# Patient Record
Sex: Male | Born: 1960 | Race: White | Hispanic: No | Marital: Married | Smoking: Never smoker
Health system: Southern US, Community
[De-identification: ages and names within clinical notes are randomized; demographics above are authoritative.]

## PROBLEM LIST (undated history)

## (undated) DIAGNOSIS — I255 Ischemic cardiomyopathy: Secondary | ICD-10-CM

## (undated) DIAGNOSIS — I2510 Atherosclerotic heart disease of native coronary artery without angina pectoris: Secondary | ICD-10-CM

## (undated) DIAGNOSIS — I10 Essential (primary) hypertension: Secondary | ICD-10-CM

## (undated) HISTORY — DX: Atherosclerotic heart disease of native coronary artery without angina pectoris: I25.10

## (undated) HISTORY — DX: Ischemic cardiomyopathy: I25.5

---

## 1999-02-17 HISTORY — PX: NECK SURGERY: SHX720

## 2000-04-07 ENCOUNTER — Inpatient Hospital Stay (HOSPITAL_COMMUNITY): Admission: RE | Admit: 2000-04-07 | Discharge: 2000-04-08 | Payer: Self-pay | Admitting: Neurosurgery

## 2000-04-07 ENCOUNTER — Encounter: Payer: Self-pay | Admitting: Neurosurgery

## 2000-04-30 ENCOUNTER — Encounter: Admission: RE | Admit: 2000-04-30 | Discharge: 2000-04-30 | Payer: Self-pay | Admitting: Neurosurgery

## 2000-04-30 ENCOUNTER — Encounter: Payer: Self-pay | Admitting: Neurosurgery

## 2000-06-11 ENCOUNTER — Encounter: Payer: Self-pay | Admitting: Neurosurgery

## 2000-06-11 ENCOUNTER — Encounter: Admission: RE | Admit: 2000-06-11 | Discharge: 2000-06-11 | Payer: Self-pay | Admitting: Neurosurgery

## 2001-09-26 ENCOUNTER — Encounter: Admission: RE | Admit: 2001-09-26 | Discharge: 2001-10-13 | Payer: Self-pay | Admitting: Unknown Physician Specialty

## 2002-12-14 ENCOUNTER — Observation Stay (HOSPITAL_COMMUNITY): Admission: EM | Admit: 2002-12-14 | Discharge: 2002-12-15 | Payer: Self-pay | Admitting: Emergency Medicine

## 2005-06-19 ENCOUNTER — Ambulatory Visit: Payer: Self-pay | Admitting: Family Medicine

## 2005-09-10 ENCOUNTER — Ambulatory Visit: Payer: Self-pay | Admitting: Family Medicine

## 2006-04-22 ENCOUNTER — Ambulatory Visit: Payer: Self-pay | Admitting: Family Medicine

## 2007-11-21 ENCOUNTER — Ambulatory Visit: Payer: Self-pay | Admitting: Cardiology

## 2007-11-23 ENCOUNTER — Ambulatory Visit: Payer: Self-pay

## 2007-12-07 ENCOUNTER — Ambulatory Visit: Payer: Self-pay | Admitting: Cardiology

## 2007-12-14 ENCOUNTER — Ambulatory Visit: Payer: Self-pay | Admitting: Cardiology

## 2008-03-09 ENCOUNTER — Ambulatory Visit (HOSPITAL_COMMUNITY): Admission: RE | Admit: 2008-03-09 | Discharge: 2008-03-09 | Payer: Self-pay | Admitting: Internal Medicine

## 2008-03-09 ENCOUNTER — Ambulatory Visit: Payer: Self-pay | Admitting: Internal Medicine

## 2010-07-01 NOTE — Assessment & Plan Note (Signed)
[facility]                            CARDIOLOGY OFFICE NOTE   Devin Jackson, Devin Jackson                         MRN:          [id number]  DATE:11/21/2007                            DOB:          December 13, 1960    PRIMARY CARE PHYSICIAN:  Dr. [name].   REASON FOR PRESENTATION:  Evaluate the patient with dyspnea.   HISTORY OF PRESENT ILLNESS:  The patient is a pleasant 50 year old  gentleman who we saw several years ago.  He had chest discomfort.  In  2004, he underwent cardiac catheterization and was found to have luminal  irregularities in obtuse marginal, but otherwise no significant disease.  He has had progressive dyspnea.  This has been over the last several  months that he has thought it to be somewhat limiting.  He describes  getting short of breath, walking less than the distance of a football  field.  If he walks up any incline.  If he climbs stairs.  He is  dyspneic with a rapid heart rate.  He feels much more fatigued than he  used to.  When he stops that he is doing, then his symptoms resolve over  minutes.  He has had not had any resting shortness of breath.  Denies  any PND or orthopnea.  He has not had any chest pressure, neck, or arm  discomfort.  He is not having any presyncope or syncope.  He did have  pulmonary function tests which demonstrated no significant abnormality.  The chest x-ray suggested cardiomegaly, but no acute diseases.  There  was minimal bibasilar atelectasis and bronchitic changes.  The patient  has had no cough, fevers, or chills.   PAST MEDICAL HISTORY:  He has no history of diabetes, hypertension, and  hyperlipidemia.   PAST SURGICAL HISTORY:  Cervical disk surgery.   ALLERGIES:  MORPHINE.   MEDICATIONS:  None.   SOCIAL HISTORY:  The patient is currently unemployed.  He is married and  has 2 children.  He has not smoked cigarettes.  He drinks alcohol  socially.   FAMILY HISTORY:  Remarkable.  His brother had  myocardial infarction at  age 52.  His father had coronary artery disease at age 53, dying of  this.   REVIEW OF SYSTEMS:  As stated in the HPI and positive for episodes of  dizziness and tingling, recently the etiology is not clear.  Negative  for all other systems.   PHYSICAL EXAMINATION:  GENERAL:  The patient is in no distress.  VITAL SIGNS:  Blood pressure is 136/82, heart rate 51 and regular,  weight 167 pounds, and body mass index not calculated.  HEENT:  Eyelids  are unremarkable, pupils equal, round, and reactive to light, fundi  within normal limits, oral mucosa unremarkable.  NECK:  No jugular  venous distention at 45 degrees, carotid upstroke brisk and symmetrical,  no bruits, no thyromegaly.  LYMPHATICS:  No cervical, axillary, or inguinal adenopathy.  LUNGS:  Clear to auscultation bilaterally.  BACK:  No costovertebral angle tenderness.  CHEST:  Unremarkable.  HEART:  PMI not displaced  or sustained, S1 and S2 within normal limits,  no S3, no S4, no clicks, rubs, or murmurs.  ABDOMEN:  Mildly obese, positive bowel sounds.  Normal in frequency and  pitch.  No bruits, no rebound, no guarding or midline pulsatile mass.  No hepatomegaly or splenomegaly.  SKIN:  No rashes, no nodules.  EXTREMITIES:  Pulses are 2+ throughout.  No edema, no cyanosis or  clubbing.  NEURO:  Oriented to person, place, and time.  Cranial nerves II-XII  grossly intact, motor grossly intact.   EKG (done in Mr. [name] office) sinus rhythm, rate 84, axis within  normal limits, intervals within normal limits, no acute ST-wave changes.   ASSESSMENT AND PLAN:  1. Dyspnea.  The patient has progressive dyspnea with exertion.  This      is starting to become somewhat limiting.  He has had a pulmonary      workup without any significant abnormalities on resting pulmonary      function test.  At this point given his family history and      obstructive coronary artery disease, he needs to be considered.   I      think there is a moderate possibility that this is the etiology.      Therefore, he needs the added sensitivity of a stress perfusion      test.  I will order an exercise Cardiolite.  If this does not      demonstrate the etiology to his dyspnea, we will might consider      cardiopulmonary stress testing.  2. Risk reduction.  The patient has his lipids followed by Mr. [name],      that suggest an LDL less than 100 and HDL greater than 40 as goals      given his family history.  3. Followup.  I will see him based on future symptoms with the results      of his stress test.     Rollene Rotunda, MD, [facility]  Electronically Signed    JH/MedQ  DD: 11/21/2007  DT: 11/22/2007  Job #: [id number]   cc:   [name]

## 2010-07-01 NOTE — Assessment & Plan Note (Signed)
[facility]                            CARDIOLOGY OFFICE NOTE   SHAUL, TRAUTMAN                         MRN:          [id number]  DATE:12/07/2007                            DOB:          12/17/1960    PRIMARY CARE PHYSICIAN:  [name], PA [facility].   REASON FOR PRESENTATION:  Evaluate the patient with dyspnea.   HISTORY OF PRESENT ILLNESS:  The patient is a pleasant 50 year old  gentleman who returns for evaluation of shortness of breath.  He had a  stress perfusion study.  This did not demonstrate any of ischemia though  his target heart rate field just short of 85% of predicted.  He  exercises 82% of predicted and was stopped because of accelerated blood  pressure response.  His maximum blood pressure was 95/119.  He did have  a little dizziness and some shortness of breath.  The shortness of  breath is unchanged from that described on November 21, 2007 note.  He  gets dyspneic walking up an incline or a flight of stairs.  He does not  describe any resting shortness of breath, does not have any PND or  orthopnea.  He has not had any weight gain or swelling.  He has not had  any cough, fevers, or chills.  He has had pulmonary workup.  He has  never been told that he had hypertension before.   PAST MEDICAL HISTORY:  He has no history of diabetes, hypertension, or  hyperlipidemia.   PAST SURGICAL HISTORY:  There was cervical disk surgery.   ALLERGIES:  MORPHINE.   MEDICATIONS:  None.   REVIEW OF SYSTEMS:  As stated in the HPI and otherwise negative for all  other systems.   PHYSICAL EXAMINATION:  GENERAL:  The patient is in no distress.  VITAL SIGNS:  Blood pressure 140/80 both arms, heart rate 60 and  regular, weight 178 pounds.  HEENT:  Eyes unremarkable.  Pupils are equal, round, and reactive to  light.  Fundi not visualized.  Oral mucosa unremarkable.  NECK:  No jugular venous distention at 45 degrees.   Carotid upstroke is  brisk and symmetric.  No bruits.  No thyromegaly.  LYMPHATICS:  No cervical, axillary, or inguinal adenopathy.  LUNGS:  Clear to auscultation bilaterally.  BACK:  No costovertebral angle tenderness.  CHEST:  Unremarkable.  HEART:  PMI not displaced or sustained.  S1 and S2 within normals.  No  S3, S4.  No clicks, rubs, murmurs.  ABDOMEN:  Flat, positive bowel sounds.  Normal in frequency and pitch.  No bruits, rebound or guarding.  No midline pulsatile mass.  No  hepatomegaly.  No splenomegaly.  SKIN:  No rashes.  No nodules.  EXTREMITIES:  2+ pulses throughout.  No edema, cyanosis, or clubbing.  NEUROLOGIC:  Oriented to person, place, and time.  Cranial nerves II  through XII are grossly intact.  Motor grossly intact.   ASSESSMENT/PLAN:  1. Dyspnea.  I wonder this might be related to his hypertensive      response with activity.  He has upper limits of normal blood      pressure today.  I am going to have him wear ambulatory blood      pressure monitor to see if this correlates with spiking blood      pressures and dyspnea and to see what his general blood pressure      is.  I believe he is going to need management for this.  If I do      not have another explanation for his dyspnea, then he might in the      future need a cardiopulmonary stress test.  2. Risk reduction.  The patient is having his lipids followed by [name].  He will continue with this.  3. Followup.  I would like to see him back after the results of the      ambulatory blood pressure monitor and make further decisions.     Rollene Rotunda, MD, [facility]  Electronically Signed    JH/MedQ  DD: 12/07/2007  DT: 12/07/2007  Job #: [id number]   cc:   [name], PA

## 2010-07-04 NOTE — Discharge Summary (Signed)
NAME:  Devin Jackson, Devin Jackson NO.:  [id number]   MEDICAL RECORD NO.:  [id number]                   PATIENT TYPE:  INP   LOCATION:  3707                                 FACILITY:  [facility]   PHYSICIAN:  Jonelle Sidle, M.D. [facility]        DATE OF BIRTH:  August 30, 1960   DATE OF ADMISSION:  12/14/2002  DATE OF DISCHARGE:  12/15/2002                           DISCHARGE SUMMARY - REFERRING   PROCEDURES:  1. Cardiac catheterization.  2. Coronary arteriogram.  3. Left ventriculogram.   HOSPITAL COURSE:  Mr. Devin Jackson is a 50 year old male with no known history of  coronary artery disease. He described a two month history of upper left  chest pain with exertion. It responded to rest in approximately 10 minutes.  He also had associated left arm aching and tingling down in his hand. He saw  Dr. Dewaine Conger and was referred to cardiology. He had recurrent symptoms and was  admitted for further evaluation and treatment.   Mr. Devin Jackson ruled out for an MI, but it was felt that cardiac catheterization  was indicated. The cardiac risk factors of dyslipidemia and family history  of premature coronary artery disease.   The cardiac catheterization showed luminal irregularities in several  vessels, but no significant stenosis and an EF of 60% with normal wall  motion. Dr. Antoine Poche evaluated the films and felt that he had minimal  coronary plaque with normal left ventricular function and therefore his  symptoms were not secondary to ischemia. He felt that primary risk reduction  was the best option. A lipid profile was checked.   Post catheterization Mr. Devin Jackson was ambulating without chest pain or shortness  of breath and his groin was stable. Lipid profile was drawn and came back  showing a total cholesterol of 231, triglycerides 435, HDL 31 and LDL not  calculated. The situation was discussed with the patient who felt that his  diet could be significantly improved and did not wish medical  therapy at  this time. His family was present as well. Mr. Devin Jackson was ambulating without  chest pain or shortness of breath and considered stable for discharge on  December 15, 2002.   LABORATORY DATA:  Hemoglobin 16.0, hematocrit 46.7, WBCs 7.9, platelets 198.  Sodium 138, potassium 4.5, chloride 104, CO2 31, BUN 12, creatinine 1.1,  glucose 99. Serial CK-MB and troponin I negative for MI.   Chest x-ray:  Borderline cardiomegaly with normal vascularity and clear  lungs. There is evidence of previous anterior cervical fusion.   CONDITION ON DISCHARGE:  Improved.   DISCHARGE DIAGNOSES:  1. Exertional chest pain, no critical coronary artery disease by     catheterization and preserved left ventricular function.  2. History of hyperlipidemia with hypertriglyceridemia.  3. Family history of premature coronary artery disease.  4. Status post neck surgery.  5. History of side effects to morphine including nausea and vomiting.   DISCHARGE INSTRUCTIONS:  His  activity level is to include no driving, sex,  or strenuous activities for two days. He is to stick to a low-fat diet and  to call the office for problems with catheterization site. He is to see Dr.  Dewaine Conger within two weeks and call Dr. Diona Browner p.r.n.   DISCHARGE MEDICATIONS:  1. Baby aspirin 81 mg daily.  2. Tylenol p.r.n.        Lavella Hammock, P.A. [facility]                  Jonelle Sidle, M.D. [facility]    RG/MEDQ  D:  12/15/2002  T:  12/15/2002  Job:  [id number]   cc:   Colon Flattery, MD  [address]  [place]  [place] [address]  Fax: [phone]

## 2010-07-04 NOTE — H&P (Signed)
[place]  Patient:    Devin Jackson, Devin Jackson                         MRN: [id number] Adm. Date:  [id number] Attending:  Barton Fanny                         History and Physical  HISTORY OF PRESENT ILLNESS:  The patient is a 50 year old, right-handed white male who was evaluated for difficulties in both his neck and his low back.  He has had a number of difficulties with numbness and tingling in his feet and hands, and a sense of weakness in his right hand.  He has found that he has been dropping things from his right hand, including hammers, paint brushes, etc.  These symptoms began in October or November 2001.  He found that turning his neck would cause neck pain.  He found that working with his upper extremities at the shoulder level or above would cause neck pain and pain to the upper extremities.  Standing would tend to cause numbness and tingling in his feet.  He noted occasionally low back pain, typically self-limited, and not particularly disabling.  The patient also underwent a neurologic evaluation and not particularly disabling.  The patient also underwent a neurologic evaluation because of difficulties with his memory.  An MRI of the brain was done which was apparently normal.  The patient had an MRI of the cervical and lumbar spine, and was evaluated in our office.  The lumbar spine showed degenerative disk disease at the L4-5 and L5-S1 levels, with small superimposed disk herniations at L4-5 and L5-S1 off to the right side, but no significant thecal sac or nerve root compression.  The cervical study showed a moderately large right C6-7 cervical disk herniation, superimposed upon degenerative disk disease and spondylosis, and the patient is admitted now for a single level C6-7 anterior cervical diskectomy and arthrodesis with allograft and cervical plating.  PAST MEDICAL HISTORY:  Notable for a history of kidney stones, but he does  not describe any history of hypertension, myocardial infarction, cancer, stroke, diabetes mellitus, peptic ulcer disease, or lung disease.  PAST SURGICAL HISTORY:  None.  ALLERGIES:  He reports that MORPHINE tends to make him sick, but he has actual allergic reaction to medications.  CURRENT MEDICATIONS:  He takes no medications on a regular basis.  FAMILY HISTORY:  His father died at age 56 of a heart attack.  He also suffered from prostate cancer and lung cancer.  His mother is in good health at age 78, but has suffered a heart attack.  SOCIAL HISTORY:  The patient is married.  He works as a self-employed Environmental education officer.  He does not smoke.  He does not drink alcoholic beverages.  He denies a history of substance abuse.  REVIEW OF SYSTEMS:  Notable for that as described in the history of present illness and past medical history.  Otherwise is unremarkable.  PHYSICAL EXAMINATION:  GENERAL:  The patient is a well-developed, well-nourished white male, in no acute distress.  VITAL SIGNS:  Temperature 97.3 degrees, pulse 57, blood pressure 130/83, respirations 18.  Height 5 feet 9 inches, weight 150 pounds.  LUNGS:  Clear to auscultation.  He had symmetrical respiratory excursion.  HEART:  A regular rate and rhythm.  Normal S1, S2.  No murmur.  ABDOMEN:  Soft, nondistended.  Bowel sounds  present.  EXTREMITIES:  Show no cyanosis, clubbing, or edema.  MUSCULOSKELETAL:  Shows no significant tenderness per the patient over the cervical spinous process or paracervical musculature, nor over the lumbosacral spinous process, nor over the paralumbar musculature.  He has a fairly good range of motion of the neck, without any significant discomfort on range of motion of the neck.  He is able to flex and extend his low back without any significant discomfort.  Straight leg raising is negative bilaterally.  NEUROLOGIC:  Shows 5/5 strength throughout the left upper extremity from  the deltoid, biceps, triceps, intrinsic, grip; however in the right upper extremity there is weakness of the right triceps at 4/5, and of the right wrist extensor at 4-/5.  The remainder of the upper extremity strength is 5/5. Sensation somewhat decreased to pinprick in the digits of his left hand as compared to the right hand, but sensation is intact to pinprick in his feet bilaterally.  Reflexes in the biceps and brachialis are 1-2 bilaterally, triceps minimal to absent bilaterally, quadriceps 2 bilaterally, gastrocnemius 1-2 bilaterally.  Toes are downgoing bilaterally.  He has a normal gait and stance.  IMPRESSION: 1. Lumbar degenerative disk disease with small disk herniations, without    evidence of radiculopathy on examination. 2. Right C7 cervical radiculopathy with right triceps and wrist extensor    weakness, secondary to a right C6-7 cervical disk herniation, superimposed    upon underlying spondylosis and degenerative disk disease.  PLAN:  The patient will be admitted for a single-level C6-7 anterior cervical diskectomy and arthrodesis with allograft and cervical plating.  We discussed alternatives to surgery, the nature of the surgical procedure itself, and the typical length of surgery, hospital stay, and overall recuperation, the need for postoperative mobilization in a soft cervical collar, and the risks of surgery including risks of infection, bleeding, possible need for transfusion, the risks of nerve root dysfunction, with pain, weakness, numbness, or paresthesias, the risks of spinal cord dysfunction with paralysis of all four limbs and quadriplegia, the risks of failure of the arthrodesis, and the risks of esophageal and/or laryngeal dysfunction with difficulty swallowing or with speech, and the anesthetic risks of myocardial infarction, stroke, pneumonia, and death.  Understanding all of this, the patient does wish to proceed with surgery and is admitted for  such. DD:  04/07/00 TD:  04/07/00 Job: [id number]

## 2010-07-04 NOTE — Cardiovascular Report (Signed)
   NAME:  Devin Jackson, Devin Jackson NO.:  [id number]   MEDICAL RECORD NO.:  [id number]                   PATIENT TYPE:  INP   LOCATION:  3707                                 FACILITY:  [facility]   PHYSICIAN:  Rollene Rotunda, M.D.                DATE OF BIRTH:  February 25, 1960   DATE OF PROCEDURE:  12/15/2002  DATE OF DISCHARGE:                              CARDIAC CATHETERIZATION   PRIMARY CARE PHYSICIAN:  Colon Flattery, MD   PROCEDURE:  1. Left heart catheterization.  2. Coronary arteriography.   INDICATIONS:  Evaluate patient with chest pain suggestive of unstable  angina.   PROCEDURAL NOTE:  Left heart catheterization performed via the right femoral  artery.  The artery was cannulated using anterior wall puncture.  A #6-  French arterial sheath was inserted via the modified Seldinger technique.  Preformed Judkins and pigtail catheter were utilized.  The patient tolerated  procedure well and left the laboratory in stable condition.   RESULTS:  HEMODYNAMICS:  LV 124/10.  AO 123/72.   CORONARIES:  The left main was normal.   The LAD was normal.  There was a small first and second diagonal which were  normal.   The circumflex in the AV groove was small and normal.   There was a small ramus intermediate which was normal.  There was a large OM  1 which was normal.  There was a large OM 2 which was branching and had  luminal irregularities.   The right coronary artery was a large, dominant vessel with luminal  irregularities.   LEFT VENTRICULOGRAM:  Left ventriculogram was obtained in the RAO  projection.  The EF was 60% with normal wall motion.    CONCLUSION:  1. Minimal coronary plaque.  2. Normal left ventricular function.   PLAN:  No further cardiovascular testing is suggested.  The patient will  have primary risk reduction.                                               Rollene Rotunda, M.D.    JH/MEDQ  D:  12/15/2002  T:  12/15/2002  Job:   [id number]   cc:   Colon Flattery, MD  [address]  [place]  [place] [address]  Fax: [phone]

## 2010-07-04 NOTE — Op Note (Signed)
[place]  Patient:    Devin Jackson, Devin Jackson                         MRN: [id number] Proc. Date: 04/07/00 Adm. Date:  [id number] Attending:  Barton Fanny                           Operative Report  PREOPERATIVE DIAGNOSIS:  Right C6-7 disk herniation superimposed upon cervical spondylosis and degenerative disk disease.  POSTOPERATIVE DIAGNOSIS:  right C6-7 disk herniation superimposed upon spondylosis and degenerative disk disease.  PROCEDURE:  C6-7 anterior cervical diskectomy and arthrodesis with iliac crest allograft and Tether cervical plating.  SURGEON:  Hewitt Shorts, M.D.  ASSISTANT:  Mena Goes. Franky Macho, M.D.  ANESTHESIA:  General endotracheal.  INDICATIONS:  The patient is a 50 year old man who presented with right C7 cervical radiculopathy with right triceps and wrist extensor weakness, who was found to have a cervical disk herniation superimposed upon underlying degenerative disk disease and spondylosis.  The decision was made to proceed with a single-level anterior cervical diskectomy and arthrodesis.  DESCRIPTION OF PROCEDURE:  Patient brought to the operating room and placed under general endotracheal anesthesia.  The patient was placed in 10 pounds of Holter traction, and the neck was prepped with Betadine soap and solution and draped in a sterile fashion.  A horizontal incision was made in the left side of the neck.  The line of the incision was infiltrated with local anesthetic with epinephrine.  The incision itself was made with a Shaw scalpel at a temperature of 120.  Dissection was carried down through the subcutaneous tissue and platysma, and then dissection was carried out through an avascular plane, leaving the sternocleidomastoid, carotid artery, and jugular vein laterally and trachea and esophagus medially.  The ventral aspect of the vertebral column was identified and a localizing x-ray taken and the   C6-7 intervertebral disk space identified.  The diskectomy was begun with incision of the annulus and continued with microcurettes and pituitary rongeurs.  The cartilaginous end plates of the vertebral bodies were removed using microcurettes and the Micro-Max drill.  We then draped the microscope and brought it into the field to provide [place] with magnification, illumination, and visualization, and the remainder of the procedure was performed using microdissection technique.  The posterior osteophytic overgrowth was removed using a Micro-Max drill and a 2 mm Kerrison punch with a thin foot plate. Foraminotomy was performed bilaterally with removal of uncinate process spurring, and the posterior longitudinal ligament was removed and the disk material compressing the thecal sac and nerve root to the right side was removed.  In the end, good decompression of the thecal sac and nerve roots was achieved bilaterally.  There was a moderate amount of epidural bleeding.  This was controlled with Gelfoam soaked in thrombin.  Once the diskectomy was completed and hemostasis established, we proceeded with the arthrodesis.  We selected a wedge of iliac crest allograft.  It was cut and shaped and placed in the intervertebral disk space and countersunk.  Anterior osteophytic overgrowth was removed using a double-action rongeur, and then we selected the 14 mm Tether cervical plate.  It was positioned over the fusion construct and secured to the vertebral bodies with a pair of 4.0 x 14 mm screws.  Each screw hole was started with an awl, and screws were placed in an alternating fashion.  All  four screws had final tightening done.  The wound was irrigated with bacitracin solution, checked for hemostasis, which was established and confirmed, and then we proceeded with closure.  An x-ray was taken, which showed that the screws at C6 were in good position, as was the graft.  We could not visualize the C7 level  well, but under direct visualization the screws were in good position.  The platysma was closed with interrupted inverted 2-0 undyed Vicryl sutures. The subcutaneous and subcuticular was closed with inverted 3-0 undyed Vicryl sutures.  The skin was reapproximated with Dermabond.  The patient tolerated the procedure well.  The estimated blood loss was 300 cc.  Sponge and needle count were correct.  Following surgery, the patient was taken out of traction, reversed from the anesthetic, extubated, and transferred to the recovery room for further care. DD:  04/08/99 TD:  04/07/00 Job: [id number]

## 2010-07-04 NOTE — H&P (Signed)
NAME:  Devin Jackson, [place] NO.:  [id number]   MEDICAL RECORD NO.:  [id number]                   PATIENT TYPE:  EMS   LOCATION:  [facility]                                 FACILITY:  [facility]   PHYSICIAN:  Jonelle Sidle, M.D. [facility]        DATE OF BIRTH:  [birth year]   DATE OF ADMISSION:  12/14/2002  DATE OF DISCHARGE:                                HISTORY & PHYSICAL   PRIMARY CARE PHYSICIAN:  Colon Flattery, MD   CHIEF COMPLAINT:  Progressive chest pain.   HISTORY OF PRESENT ILLNESS:  Devin Jackson is a pleasant 50 year old male with a  reported history of hypertriglyceridemia but no prior cardiac history.  He  describes a progressive several week history of chest pressure predominantly  involving the left upper chest area which radiation to the left arm at  times.  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  Typically when he rests for approximately 10 minutes his symptoms  abate.  They do not occur at all times.  He has occasionally had associated  diaphoresis with more intense episodes.  This Monday, while he was painting  a ceiling he developed an intense episode described as 7/10 in intensity  that gradually eased over one hour with rest.  He had a recurrent episode  today while loading some tools into his truck that was of less intensity and  was eventually seen in Dr. Karmen Stabs office where a 12 lead electrocardiogram  showed normal sinus rhythm with no acute changes.  He was subsequently  referred here for further evaluation.  He has had no prior cardiac testing.  Of note, his 54 year old mother is presently at [facility]  awaiting coronary artery bypass grafting surgery in the morning.  There is a  potential family history with the patient's father having a myocardial  infarction in his early 51's and a brother having a myocardial infarction at  age 75.  At present he is pain-free.   ALLERGIES:  MORPHINE leads to  nausea and emesis.   MEDICATIONS:  The patient is on no chronic medications as an outpatient.  He  uses Aleve as needed for pain.   PAST MEDICAL HISTORY:  1. Reported history of dyslipidemia, specifically hypertriglyceridemia, no     formal profile available.  He has not been on any specific medications.  2. Apparent history of nephrolithiasis by description.  3. Prior history of neck surgery in 2002.  4. No clear history of type 2 diabetes mellitus, hypertension,     cerebrovascular disease or coronary artery disease.   SOCIAL HISTORY:  Patient lives in [place] with his wife of 22 years.  He has  two sons.  He drinks alcohol occasionally and denies tobacco use or any  other illicit drug use.   FAMILY HISTORY:  As described above.   REVIEW OF SYMPTOMS:  As described in history  of present illness. He has some  arthritic complaints at times with his work.  He also complains of  occasional dizziness but no syncope.   PHYSICAL EXAMINATION:  VITAL SIGNS:  Temperature 94.8 degrees, heart rate is  87, respirations 16, blood pressure 116/60.  Oxygen saturation is 100% on  room air.  GENERAL:  This is a well-nourished male lying supine in no acute distress.  HEENT:  Conjunctiva normal.  Pharynx is clear.  NECK:  Supple without jugular venous pressure and without bruits.  No  thyromegaly is noted.  LUNGS:  Clear to auscultation bilaterally with non-labored breathing at  rest.  CARDIAC:  Examination reveals a regular rate and rhythm without loud murmur  or S3 gallop.  There is no pericardial rub.  ABDOMEN:  Soft without hepatomegaly or bruits.  EXTREMITIES:  No cyanosis, clubbing or edema.  Peripheral pulses are 2+.  SKIN:  No ulcerative changes.  MUSCULOSKELETAL:  No changes noted.  NEUROPSYCHIATRIC: Patient is alert and oriented x3.  Affect is normal.   RADIOLOGICAL AND LABORATORY DATA:  Chest x-ray is currently pending.   White blood cell count 6.3, hemoglobin 16.5, platelet count  188,000.  INR  0.9.  Sodium 138, potassium 4.5, glucose 99, BUN 12, creatinine 1.1.  Liver  function tests are normal.  Initial CK is 97.  Troponin-I is pending.   IMPRESSION:  1. Progressive exertional chest pain in a 50 year old male with history of     dyslipidemia and some family history of premature cardiovascular disease.     Electrocardiogram at rest is nonspecific.  Cardiac markers are currently     pending.  2. Reported history of dyslipidemia.   PLAN:  1. Will admit the patient to telemetry and continue to cycle cardiac     markers.  2. Will start aspirin, Lopressor and intravenous heparin.  3. Will plan diagnostic coronary angiography for the morning to clearly     outline the coronary anatomy.  4. Check fasting lipid profile.                                                Jonelle Sidle, M.D. [facility]    SGM/MEDQ  D:  12/14/2002  T:  12/14/2002  Job:  [id number]

## 2019-11-18 ENCOUNTER — Other Ambulatory Visit: Payer: Self-pay

## 2019-11-18 ENCOUNTER — Inpatient Hospital Stay (HOSPITAL_COMMUNITY)
Admission: EM | Admit: 2019-11-18 | Discharge: 2019-11-20 | DRG: 177 | Disposition: A | Discharge status: Home / Self Care | Payer: HRSA Program | Attending: Internal Medicine | Admitting: Internal Medicine

## 2019-11-18 ENCOUNTER — Emergency Department (HOSPITAL_COMMUNITY): Payer: HRSA Program

## 2019-11-18 ENCOUNTER — Encounter (HOSPITAL_COMMUNITY): Payer: Self-pay

## 2019-11-18 DIAGNOSIS — D751 Secondary polycythemia: Secondary | ICD-10-CM | POA: Diagnosis present

## 2019-11-18 DIAGNOSIS — D696 Thrombocytopenia, unspecified: Secondary | ICD-10-CM

## 2019-11-18 DIAGNOSIS — U071 COVID-19: Secondary | ICD-10-CM | POA: Diagnosis present

## 2019-11-18 DIAGNOSIS — Z885 Allergy status to narcotic agent status: Secondary | ICD-10-CM

## 2019-11-18 DIAGNOSIS — J069 Acute upper respiratory infection, unspecified: Secondary | ICD-10-CM

## 2019-11-18 DIAGNOSIS — J1282 Pneumonia due to coronavirus disease 2019: Secondary | ICD-10-CM | POA: Diagnosis present

## 2019-11-18 DIAGNOSIS — J9601 Acute respiratory failure with hypoxia: Secondary | ICD-10-CM | POA: Diagnosis present

## 2019-11-18 LAB — CBC WITH DIFFERENTIAL/PLATELET
Abs Immature Granulocytes: 0.02 10*3/uL (ref 0.00–0.07)
Basophils Absolute: 0 10*3/uL (ref 0.0–0.1)
Basophils Relative: 0 %
Eosinophils Absolute: 0 10*3/uL (ref 0.0–0.5)
Eosinophils Relative: 0 %
HCT: 50.4 % (ref 39.0–52.0)
Hemoglobin: 17.2 g/dL — ABNORMAL HIGH (ref 13.0–17.0)
Immature Granulocytes: 0 %
Lymphocytes Relative: 12 %
Lymphs Abs: 0.7 10*3/uL (ref 0.7–4.0)
MCH: 29.2 pg (ref 26.0–34.0)
MCHC: 34.1 g/dL (ref 30.0–36.0)
MCV: 85.6 fL (ref 80.0–100.0)
Monocytes Absolute: 0.4 10*3/uL (ref 0.1–1.0)
Monocytes Relative: 6 %
Neutro Abs: 4.7 10*3/uL (ref 1.7–7.7)
Neutrophils Relative %: 82 %
Platelets: 93 10*3/uL — ABNORMAL LOW (ref 150–400)
RBC: 5.89 MIL/uL — ABNORMAL HIGH (ref 4.22–5.81)
RDW: 13.6 % (ref 11.5–15.5)
WBC: 5.8 10*3/uL (ref 4.0–10.5)
nRBC: 0 % (ref 0.0–0.2)

## 2019-11-18 LAB — LACTIC ACID, PLASMA
Lactic Acid, Venous: 1 mmol/L (ref 0.5–1.9)
Lactic Acid, Venous: 0.9 mmol/L (ref 0.5–1.9)

## 2019-11-18 LAB — TRIGLYCERIDES: Triglycerides: 200 mg/dL — ABNORMAL HIGH (ref <150)

## 2019-11-18 LAB — FERRITIN: Ferritin: 830 ng/mL — ABNORMAL HIGH (ref 24–336)

## 2019-11-18 LAB — COMPREHENSIVE METABOLIC PANEL
ALT: 39 U/L (ref 0–44)
AST: 35 U/L (ref 15–41)
Albumin: 3.6 g/dL (ref 3.5–5.0)
Alkaline Phosphatase: 73 U/L (ref 38–126)
Anion gap: 10 (ref 5–15)
BUN: 20 mg/dL (ref 6–20)
CO2: 22 mmol/L (ref 22–32)
Calcium: 8.4 mg/dL — ABNORMAL LOW (ref 8.9–10.3)
Chloride: 100 mmol/L (ref 98–111)
Creatinine, Ser: 1.23 mg/dL (ref 0.61–1.24)
GFR calc Af Amer: >60 mL/min (ref >60)
GFR calc non Af Amer: >60 mL/min (ref >60)
Glucose, Bld: 111 mg/dL — ABNORMAL HIGH (ref 70–99)
Potassium: 3.9 mmol/L (ref 3.5–5.1)
Sodium: 132 mmol/L — ABNORMAL LOW (ref 135–145)
Total Bilirubin: 0.4 mg/dL (ref 0.3–1.2)
Total Protein: 7.6 g/dL (ref 6.5–8.1)

## 2019-11-18 LAB — RESPIRATORY PANEL BY RT PCR (FLU A&B, COVID)
Influenza A by PCR: NEGATIVE
Influenza B by PCR: NEGATIVE
SARS Coronavirus 2 by RT PCR: POSITIVE — AB

## 2019-11-18 LAB — C-REACTIVE PROTEIN: CRP: 8.5 mg/dL — ABNORMAL HIGH (ref <1.0)

## 2019-11-18 LAB — PROCALCITONIN: Procalcitonin: 0.15 ng/mL

## 2019-11-18 LAB — LACTATE DEHYDROGENASE: LDH: 189 U/L (ref 98–192)

## 2019-11-18 LAB — D-DIMER, QUANTITATIVE: D-Dimer, Quant: 0.66 ug/mL-FEU — ABNORMAL HIGH (ref 0.00–0.50)

## 2019-11-18 LAB — FIBRINOGEN: Fibrinogen: 693 mg/dL — ABNORMAL HIGH (ref 210–475)

## 2019-11-18 MED ORDER — SODIUM CHLORIDE 0.9 % IV SOLN: INTRAVENOUS | Status: AC

## 2019-11-18 MED ORDER — SODIUM CHLORIDE 0.9% FLUSH
3.0000 mL | Freq: Two times a day (BID) | INTRAVENOUS | Status: DC
  Administered 2019-11-19 – 2019-11-20 (×4): 3 mL via INTRAVENOUS

## 2019-11-18 MED ORDER — PREDNISONE 20 MG PO TABS: 50.0000 mg | ORAL_TABLET | Freq: Every day | ORAL | Status: DC

## 2019-11-18 MED ORDER — SODIUM CHLORIDE 0.9 % IV SOLN: 100.0000 mg | Freq: Every day | INTRAVENOUS | Status: DC

## 2019-11-18 MED ORDER — SODIUM CHLORIDE 0.9 % IV SOLN
100.0000 mg | Freq: Every day | INTRAVENOUS | Status: DC
  Administered 2019-11-19 – 2019-11-20 (×2): 100 mg via INTRAVENOUS
  Filled 2019-11-18 (×2): qty 20

## 2019-11-18 MED ORDER — METHYLPREDNISOLONE SODIUM SUCC 125 MG IJ SOLR
1.0000 mg/kg | Freq: Two times a day (BID) | INTRAMUSCULAR | Status: DC
  Administered 2019-11-18 – 2019-11-20 (×4): 76.875 mg via INTRAVENOUS
  Filled 2019-11-18 (×4): qty 2

## 2019-11-18 MED ORDER — HYDROCOD POLST-CPM POLST ER 10-8 MG/5ML PO SUER
5.0000 mL | Freq: Two times a day (BID) | ORAL | Status: DC | PRN
  Administered 2019-11-19: 5 mL via ORAL
  Filled 2019-11-18: qty 5

## 2019-11-18 MED ORDER — DEXAMETHASONE SODIUM PHOSPHATE 10 MG/ML IJ SOLN
10.0000 mg | Freq: Once | INTRAMUSCULAR | Status: AC
  Administered 2019-11-18: 10 mg via INTRAVENOUS
  Filled 2019-11-18: qty 1

## 2019-11-18 MED ORDER — ACETAMINOPHEN 325 MG PO TABS
650.0000 mg | ORAL_TABLET | Freq: Once | ORAL | Status: AC
  Administered 2019-11-18: 650 mg via ORAL
  Filled 2019-11-18: qty 2

## 2019-11-18 MED ORDER — ENOXAPARIN SODIUM 40 MG/0.4ML ~~LOC~~ SOLN
40.0000 mg | SUBCUTANEOUS | Status: DC
  Administered 2019-11-18 – 2019-11-19 (×2): 40 mg via SUBCUTANEOUS
  Filled 2019-11-18 (×2): qty 0.4

## 2019-11-18 MED ORDER — ALBUTEROL SULFATE HFA 108 (90 BASE) MCG/ACT IN AERS
6.0000 | INHALATION_SPRAY | Freq: Once | RESPIRATORY_TRACT | Status: AC
  Administered 2019-11-18: 6 via RESPIRATORY_TRACT
  Filled 2019-11-18: qty 6.7

## 2019-11-18 MED ORDER — SODIUM CHLORIDE 0.9 % IV SOLN: 200.0000 mg | Freq: Once | INTRAVENOUS | Status: DC

## 2019-11-18 MED ORDER — PANTOPRAZOLE SODIUM 40 MG PO TBEC
40.0000 mg | DELAYED_RELEASE_TABLET | Freq: Every day | ORAL | Status: DC
  Administered 2019-11-19 – 2019-11-20 (×2): 40 mg via ORAL
  Filled 2019-11-18 (×2): qty 1

## 2019-11-18 MED ORDER — GUAIFENESIN-DM 100-10 MG/5ML PO SYRP
10.0000 mL | ORAL_SOLUTION | ORAL | Status: DC | PRN
  Administered 2019-11-20: 10 mL via ORAL
  Filled 2019-11-18: qty 10

## 2019-11-18 MED ORDER — SODIUM CHLORIDE 0.9 % IV SOLN
100.0000 mg | Freq: Once | INTRAVENOUS | Status: AC
  Administered 2019-11-18: 100 mg via INTRAVENOUS
  Filled 2019-11-18: qty 20

## 2019-11-18 MED ORDER — SODIUM CHLORIDE 0.9 % IV SOLN
250.0000 mL | INTRAVENOUS | Status: DC | PRN
  Administered 2019-11-20: 250 mL via INTRAVENOUS

## 2019-11-18 MED ORDER — SODIUM CHLORIDE 0.9% FLUSH: 3.0000 mL | INTRAVENOUS | Status: DC | PRN

## 2019-11-18 NOTE — H&P (Addendum)
[facility] H&P   Patient Demographics:    Devin Jackson, is a 59 y.o. male  MRN: [id number]   DOB - 1960/11/04  Admit Date - 11/18/2019  Outpatient Primary MD for the patient is Patient, No Pcp Per  Referring MD/NP/PA: Dr. Hyacinth Meeker  Patient coming from: Home  Chief Complaint  Patient presents with  . Shortness of Breath    Patient reports SOB, fever, bodyaches, non-productive cough since Sunday. Recent Covid exposure. Results pending.       HPI:    Devin Jackson  is a 59 y.o. male, without significant past medical history not taking any daily medications, patient presents to ED secondary to complaints of fever, body ache, and shortness of breath, patient reports he has been feeling sick for 6 days, symptoms started last Sunday generalized body ache, cough and shortness of breath, as well he does report fevers, reports poor oral intake and no appetite, reports nausea, but no vomiting, no diarrhea, patient reports he is unvaccinated against Covid, he is unaware of any specific Covid contacts, he had been tested for Covid several days ago, but results are still pending. -In ED he was noted to be hypoxic 85% on room air, febrile 103.2, chest x-ray significant for multifocal opacity, COVID-19 positive, CRP elevated at 8.5, hemoglobin elevated at 17.2, platelets low at 90 3K, no leukocytosis, mild hyponatremia at 132, and creatinine elevated at 1.23, no baseline to compare, patient was started on steroid, [facility] hospitalist consulted to admit.    Review of systems:    In addition to the HPI above,  Reports fever and chills, febrile 100.2 in ED, report generalized weakness, fatigue and poor appetite No Headache, No changes with Vision or hearing, No problems swallowing food or Liquids, No Chest pain, ports cough and shortness of breath No Abdominal pain, No Nausea or Vommitting, Bowel movements are  regular, No Blood in stool or Urine, No dysuria, No new skin rashes or bruises, Report generalized body ache No new weakness, tingling, numbness in any extremity, No recent weight gain or loss, No polyuria, polydypsia or polyphagia, No significant Mental Stressors.  A full 10 point Review of Systems was done, except as stated above, all other Review of Systems were negative.   With Past History of the following :    Patient with no chronic condition, no past medical history.   Social History:     Social History   Tobacco Use  . Smoking status: Never Smoker  Substance Use Topics  . Alcohol use: Yes    Alcohol/week: 6.0 standard drinks    Types: 6 Cans of beer per week    Comment: Over two weeks        Family History :   Family history was reviewed, nonpertinent   Home Medications:   Patient is not taking any home medications       Allergies:  Allergies  Allergen Reactions  . Morphine And Related Nausea And Vomiting     Physical Exam:   Vitals  Blood pressure (!) 113/59, pulse 95, temperature 100.2 F (37.9 C), temperature source Oral, resp. rate (!) 31, height 5\' 9"  (1.753 m), weight 77.1 kg, SpO2 95 %.   1. General well developed male, laying in bed, in no apparent distress.  2. Normal affect and insight, Not Suicidal or Homicidal, Awake Alert, Oriented X 3.  3. No F.N deficits, ALL C.Nerves Intact, Strength 5/5 all 4 extremities, Sensation intact all 4 extremities, Plantars down going.  4. Ears and Eyes appear Normal, Conjunctivae clear, PERRLA. Moist Oral Mucosa.  5. Supple Neck, No JVD, No cervical lymphadenopathy appriciated, No Carotid Bruits.  6. Symmetrical Chest wall movement, Good air movement bilaterally, CTAB, patient is mildly tachypneic, but no use of accessory muscles.  7. RRR, No Gallops, Rubs or Murmurs, No Parasternal Heave.  8. Positive Bowel Sounds, Abdomen Soft, No tenderness, No organomegaly appriciated,No rebound  -guarding or rigidity.  9.  No Cyanosis, Normal Skin Turgor, No Skin Rash or Bruise.  10. Good muscle tone,  joints appear normal , no effusions, Normal ROM.  11. No Palpable Lymph Nodes in Neck or Axillae    Data Review:    CBC Recent Labs  Lab 11/18/19 1711  WBC 5.8  HGB 17.2*  HCT 50.4  PLT 93*  MCV 85.6  MCH 29.2  MCHC 34.1  RDW 13.6  LYMPHSABS 0.7  MONOABS 0.4  EOSABS 0.0  BASOSABS 0.0   ------------------------------------------------------------------------------------------------------------------  Chemistries  Recent Labs  Lab 11/18/19 1711  NA 132*  K 3.9  CL 100  CO2 22  GLUCOSE 111*  BUN 20  CREATININE 1.23  CALCIUM 8.4*  AST 35  ALT 39  ALKPHOS 73  BILITOT 0.4   ------------------------------------------------------------------------------------------------------------------ estimated creatinine clearance is 63.9 mL/min (by C-G formula based on SCr of 1.23 mg/dL). ------------------------------------------------------------------------------------------------------------------ No results for input(s): TSH, T4TOTAL, T3FREE, THYROIDAB in the last 72 hours.  Invalid input(s): FREET3  Coagulation profile No results for input(s): INR, PROTIME in the last 168 hours. ------------------------------------------------------------------------------------------------------------------- Recent Labs    11/18/19 1711  DDIMER 0.66*   -------------------------------------------------------------------------------------------------------------------  Cardiac Enzymes No results for input(s): CKMB, TROPONINI, MYOGLOBIN in the last 168 hours.  Invalid input(s): CK ------------------------------------------------------------------------------------------------------------------ No results found for: BNP   ---------------------------------------------------------------------------------------------------------------  Urinalysis No results found for:  COLORURINE, APPEARANCEUR, LABSPEC, PHURINE, GLUCOSEU, HGBUR, BILIRUBINUR, KETONESUR, PROTEINUR, UROBILINOGEN, NITRITE, LEUKOCYTESUR  ----------------------------------------------------------------------------------------------------------------   Imaging Results:    DG Chest Port 1 View  Result Date: 11/18/2019 CLINICAL DATA:  Cough and shortness of breath. EXAM: PORTABLE CHEST 1 VIEW COMPARISON:  None. FINDINGS: Mild, diffuse, chronic appearing increased interstitial lung markings are seen. Mild areas of linear atelectasis are seen along the lateral aspect of the left lung base. There is no evidence of a pleural effusion or pneumothorax. The heart size and mediastinal contours are within normal limits. A radiopaque fusion plate and screws are seen overlying the lower cervical spine. Degenerative changes are noted throughout the thoracic spine. IMPRESSION: Chronic appearing increased interstitial lung markings with mild left basilar linear atelectasis. Electronically Signed   By: 01/18/2020 M.D.   On: 11/18/2019 17:38    My personal review of EKG: Rhythm NSR, Rate  97 /min, QTc 422 , no Acute ST changes   Assessment & Plan:    Active Problems:   Acute respiratory disease due to COVID-19 virus   Thrombocytopenia (HCC)   Polycythemia  Acute hypoxic respiratory failure due to COVID-19 pneumonia -He is unvaccinated. -Chest x-ray significant for multifocal opacity, hypoxic 85% on room air, saturation 93% on 2 L nasal cannula. -Procalcitonin 0.15, no indication for antibiotics. -Need to trend inflammatory markers daily -Patient will be started on IV steroids, transition from Decadron to Solu-Medrol. -He will be started on IV remdesivir. -Have discussed with patient about EUA approval for baricitinib/Actemra, he denies any history of TB, diverticulitis, bowel perforation or viral hepatitis, he is agreeable to use if it felt indicated, for now given he is on 2 L nasal cannula will  hold. -He was encouraged to use incentive spirometry, flutter valve, out of bed to chair    COVID-19 Labs  Recent Labs    11/18/19 1711  DDIMER 0.66*  FERRITIN 830*  LDH 189  CRP 8.5*    Lab Results  Component Value Date   SARSCOV2NAA POSITIVE (A) 11/18/2019    Polycythemia/AKI/hyponatremia due to dehydration -This is due to volume depletion and dehydration, as he has poor appetite and poor oral intake for last 4 days, no history of smoking, he will be kept on gentle hydration at 50 cc/h over the next 20 hours, then reassess him for the need of IV fluids.  Thrombocytopenia -Likely due to Covid, will monitor closely as on subcu Lovenox for DVT prophylaxis.  DVT Prophylaxis  Lovenox  AM Labs Ordered, also please review Full Orders  Family Communication: Admission, patients condition and plan of care including tests being ordered have been discussed with the patient who indicate understanding and agree with the plan and Code Status.  Code Status full  Likely DC to home  Condition GUARDED*  Consults called: None  Admission status: Inpatient  Time spent in minutes : 60 minutes   Huey Bienenstock M.D on 11/18/2019 at 8:07 PM   [facility] - Office  [phone]

## 2019-11-18 NOTE — ED Provider Notes (Signed)
[facility] EMERGENCY DEPARTMENT Provider Note   CSN: [id number] Arrival date & time: 11/18/19  1622     History Chief Complaint  Patient presents with  . Shortness of Breath    Patient reports SOB, fever, bodyaches, non-productive cough since Sunday. Recent Covid exposure. Results pending.     Devin Jackson is a 59 y.o. male.  HPI   This patient is a 59 year old male, he has a known history of approximately 6 cans of beer per week, he denies taking any daily medications and has never been diagnosed with any chronic medical conditions.  Approximately 6 days ago the patient developed fevers and body aches.  This is progressed throughout the week and is associated with increasing coughing and shortness of breath.  He is nauseated but not vomiting or having any diarrhea.  His symptoms became more severe today prompting his visit to the emergency department where he was found to be tachypneic and hypoxic to 86% on room air.  The patient is not aware of having any specific contacts but thinks he may have been exposed to Covid, he had a test several days ago which is pending.  No past medical history on file.  There are no problems to display for this patient.      No family history on file.  Social History   Tobacco Use  . Smoking status: Never Smoker  Substance Use Topics  . Alcohol use: Yes    Alcohol/week: 6.0 standard drinks    Types: 6 Cans of beer per week    Comment: Over two weeks  . Drug use: Never    Home Medications Prior to Admission medications   Not on File    Allergies    Morphine and related  Review of Systems   Review of Systems  All other systems reviewed and are negative.   Physical Exam Updated Vital Signs BP 127/84   Pulse 96   Temp (!) 103.2 F (39.6 C) (Oral)   Resp (!) 31   Ht 1.753 m (5\' 9" )   Wt 77.1 kg   SpO2 95%   BMI 25.10 kg/m   Physical Exam Vitals and nursing note reviewed.  Constitutional:      General: He is not in  acute distress.    Appearance: He is well-developed.  HENT:     Head: Normocephalic and atraumatic.     Mouth/Throat:     Pharynx: No oropharyngeal exudate.  Eyes:     General: No scleral icterus.       Right eye: No discharge.        Left eye: No discharge.     Conjunctiva/sclera: Conjunctivae normal.     Pupils: Pupils are equal, round, and reactive to light.  Neck:     Thyroid: No thyromegaly.     Vascular: No JVD.  Cardiovascular:     Rate and Rhythm: Regular rhythm. Tachycardia present.     Heart sounds: Normal heart sounds. No murmur heard.  No friction rub. No gallop.   Pulmonary:     Effort: Respiratory distress present.     Breath sounds: Rales present. No wheezing.  Abdominal:     General: Bowel sounds are normal. There is no distension.     Palpations: Abdomen is soft. There is no mass.     Tenderness: There is no abdominal tenderness.  Musculoskeletal:        General: No tenderness. Normal range of motion.     Cervical back: Normal range  of motion and neck supple.  Lymphadenopathy:     Cervical: No cervical adenopathy.  Skin:    General: Skin is warm and dry.     Findings: No erythema or rash.  Neurological:     Mental Status: He is alert.     Coordination: Coordination normal.  Psychiatric:        Behavior: Behavior normal.     ED Results / Procedures / Treatments   Labs (all labs ordered are listed, but only abnormal results are displayed) Labs Reviewed  RESPIRATORY PANEL BY RT PCR (FLU A&B, COVID) - Abnormal; Notable for the following components:      Result Value   SARS Coronavirus 2 by RT PCR POSITIVE (*)    All other components within normal limits  CBC WITH DIFFERENTIAL/PLATELET - Abnormal; Notable for the following components:   RBC 5.89 (*)    Hemoglobin 17.2 (*)    Platelets 93 (*)    All other components within normal limits  COMPREHENSIVE METABOLIC PANEL - Abnormal; Notable for the following components:   Sodium 132 (*)    Glucose, Bld  111 (*)    Calcium 8.4 (*)    All other components within normal limits  D-DIMER, QUANTITATIVE (NOT AT [facility]) - Abnormal; Notable for the following components:   D-Dimer, Quant 0.66 (*)    All other components within normal limits  FERRITIN - Abnormal; Notable for the following components:   Ferritin 830 (*)    All other components within normal limits  TRIGLYCERIDES - Abnormal; Notable for the following components:   Triglycerides 200 (*)    All other components within normal limits  FIBRINOGEN - Abnormal; Notable for the following components:   Fibrinogen 693 (*)    All other components within normal limits  C-REACTIVE PROTEIN - Abnormal; Notable for the following components:   CRP 8.5 (*)    All other components within normal limits  CULTURE, BLOOD (ROUTINE X 2)  CULTURE, BLOOD (ROUTINE X 2)  LACTIC ACID, PLASMA  PROCALCITONIN  LACTATE DEHYDROGENASE  LACTIC ACID, PLASMA    EKG EKG Interpretation  Date/Time:  Saturday November 18 2019 17:29:41 EDT Ventricular Rate:  97 PR Interval:    QRS Duration: 89 QT Interval:  332 QTC Calculation: 422 R Axis:   14 Text Interpretation: Sinus rhythm Since last tracing rate faster Confirmed by Eber Hong ([id number]) on 11/18/2019 5:40:51 PM   Radiology DG Chest Port 1 View  Result Date: 11/18/2019 CLINICAL DATA:  Cough and shortness of breath. EXAM: PORTABLE CHEST 1 VIEW COMPARISON:  None. FINDINGS: Mild, diffuse, chronic appearing increased interstitial lung markings are seen. Mild areas of linear atelectasis are seen along the lateral aspect of the left lung base. There is no evidence of a pleural effusion or pneumothorax. The heart size and mediastinal contours are within normal limits. A radiopaque fusion plate and screws are seen overlying the lower cervical spine. Degenerative changes are noted throughout the thoracic spine. IMPRESSION: Chronic appearing increased interstitial lung markings with mild left basilar linear atelectasis.  Electronically Signed   By: Aram Candela M.D.   On: 11/18/2019 17:38    Procedures .Critical Care Performed by: Eber Hong, MD Authorized by: Eber Hong, MD   Critical care provider statement:    Critical care time (minutes):  35   Critical care time was exclusive of:  Separately billable procedures and treating other patients and teaching time   Critical care was necessary to treat or prevent imminent or life-threatening deterioration  of the following conditions:  Respiratory failure   Critical care was time spent personally by me on the following activities:  Blood draw for specimens, development of treatment plan with patient or surrogate, discussions with consultants, evaluation of patient's response to treatment, examination of patient, obtaining history from patient or surrogate, ordering and performing treatments and interventions, ordering and review of laboratory studies, ordering and review of radiographic studies, pulse oximetry, re-evaluation of patient's condition and review of old charts   (including critical care time)  Medications Ordered in ED Medications  sodium chloride flush (NS) 0.9 % injection 3 mL (has no administration in time range)  sodium chloride flush (NS) 0.9 % injection 3 mL (has no administration in time range)  0.9 %  sodium chloride infusion (has no administration in time range)  acetaminophen (TYLENOL) tablet 650 mg (650 mg Oral Given 11/18/19 1757)  albuterol (VENTOLIN HFA) 108 (90 Base) MCG/ACT inhaler 6 puff (6 puffs Inhalation Given 11/18/19 1926)  dexamethasone (DECADRON) injection 10 mg (10 mg Intravenous Given 11/18/19 1926)    ED Course  I have reviewed the triage vital signs and the nursing notes.  Pertinent labs & imaging results that were available during my care of the patient were reviewed by me and considered in my medical decision making (see chart for details).    MDM Rules/Calculators/A&P                          The  patient is tachycardic, tachypneic, has hypoxia to 86% requiring 2 to 3 L by nasal cannula but is doing well on that.  Temperature is 103.2 and the pulse is 101.  At this time the patient will need to be evaluated for Covid, pneumonia, blood cultures will be obtained, Covid test will be taken and an x-ray will be performed.  He does appear acutely ill with acute hypoxic respiratory failure.  X-ray shows some atelectasis or early infiltrates, temperature is 103.2, borderline tachycardia, tachypneic and hypoxic to mid to upper 80% range.  Covid test is positive, inflammatory markers are elevated, D-dimer is minimally elevated and I do not think he needs evaluation for pulmonary embolism.  This is far more likely to be infectious.  He has acute hypoxic respiratory failure secondary to COVID-19.  I discussed his care with the hospitalist who will admit.  Critical interventions including albuterol metered-dose inhaler multiple puffs, Decadron, admission to the hospital to high level of care with supplemental oxygen  Devin Jackson was evaluated in Emergency Department on 11/18/2019 for the symptoms described in the history of present illness. He was evaluated in the context of the global COVID-19 pandemic, which necessitated consideration that the patient might be at risk for infection with the SARS-CoV-2 virus that causes COVID-19. Institutional protocols and algorithms that pertain to the evaluation of patients at risk for COVID-19 are in a state of rapid change based on information released by regulatory bodies including the CDC and federal and state organizations. These policies and algorithms were followed during the patient's care in the ED.   Final Clinical Impression(s) / ED Diagnoses Final diagnoses:  Acute hypoxemic respiratory failure due to COVID-19 [facility])    Rx / DC Orders ED Discharge Orders    None       Eber Hong, MD 11/18/19 1929

## 2019-11-18 NOTE — ED Notes (Signed)
Pt denies SI or HI at this time. Observation camera removed from room. Pt resting on stretcher rails up x 2 call light in hand curtain open for direct observation. Pt a/o x 4 skin warm dry intact, Pt remains calm cooperative and denies needs at this time. VSS NAD PT continues on cardiac monitor and pulse ox.

## 2019-11-18 NOTE — ED Notes (Signed)
Patient denies SI or HI at this time. Sitter order discontinued.

## 2019-11-18 NOTE — ED Notes (Signed)
Date and time results received: 11/18/19 1901 (use smartphrase ".now" to insert current time)  Test: Covid Critical Value: Positive  Name of Provider Notified: Dr. Hyacinth Meeker Orders Received? Or Actions Taken?:

## 2019-11-18 NOTE — ED Notes (Signed)
Pt tolerated self ambulation inside exam room with 93% O2 sat. Pt remains a/o x 4 skin warm dry intact. Pt denies needs at this time and continues on 2L [place]. Pt is now resting on stretcher railsup x 2 call light in hand, cardiac monitor and pulse ox applied.

## 2019-11-18 NOTE — ED Notes (Signed)
Room has not been cleared at this time due to patient not being medically cleared. Telesitter at bedside. Patient denies active plan to harm self.

## 2019-11-19 ENCOUNTER — Encounter (HOSPITAL_COMMUNITY): Payer: Self-pay | Admitting: Internal Medicine

## 2019-11-19 ENCOUNTER — Other Ambulatory Visit: Payer: Self-pay

## 2019-11-19 DIAGNOSIS — J069 Acute upper respiratory infection, unspecified: Secondary | ICD-10-CM

## 2019-11-19 LAB — HIV ANTIBODY (ROUTINE TESTING W REFLEX): HIV Screen 4th Generation wRfx: NONREACTIVE

## 2019-11-19 LAB — CBC WITH DIFFERENTIAL/PLATELET
Abs Immature Granulocytes: 0.03 10*3/uL (ref 0.00–0.07)
Basophils Absolute: 0 10*3/uL (ref 0.0–0.1)
Basophils Relative: 1 %
Eosinophils Absolute: 0 10*3/uL (ref 0.0–0.5)
Eosinophils Relative: 0 %
HCT: 52.2 % — ABNORMAL HIGH (ref 39.0–52.0)
Hemoglobin: 17.3 g/dL — ABNORMAL HIGH (ref 13.0–17.0)
Immature Granulocytes: 1 %
Lymphocytes Relative: 14 %
Lymphs Abs: 0.6 10*3/uL — ABNORMAL LOW (ref 0.7–4.0)
MCH: 28.5 pg (ref 26.0–34.0)
MCHC: 33.1 g/dL (ref 30.0–36.0)
MCV: 85.9 fL (ref 80.0–100.0)
Monocytes Absolute: 0.2 10*3/uL (ref 0.1–1.0)
Monocytes Relative: 4 %
Neutro Abs: 3.2 10*3/uL (ref 1.7–7.7)
Neutrophils Relative %: 80 %
Platelets: 94 10*3/uL — ABNORMAL LOW (ref 150–400)
RBC: 6.08 MIL/uL — ABNORMAL HIGH (ref 4.22–5.81)
RDW: 13.7 % (ref 11.5–15.5)
WBC: 4 10*3/uL (ref 4.0–10.5)
nRBC: 0 % (ref 0.0–0.2)

## 2019-11-19 LAB — COMPREHENSIVE METABOLIC PANEL
ALT: 39 U/L (ref 0–44)
AST: 36 U/L (ref 15–41)
Albumin: 3.5 g/dL (ref 3.5–5.0)
Alkaline Phosphatase: 72 U/L (ref 38–126)
Anion gap: 11 (ref 5–15)
BUN: 25 mg/dL — ABNORMAL HIGH (ref 6–20)
CO2: 21 mmol/L — ABNORMAL LOW (ref 22–32)
Calcium: 8.8 mg/dL — ABNORMAL LOW (ref 8.9–10.3)
Chloride: 103 mmol/L (ref 98–111)
Creatinine, Ser: 1.06 mg/dL (ref 0.61–1.24)
GFR calc Af Amer: >60 mL/min (ref >60)
GFR calc non Af Amer: >60 mL/min (ref >60)
Glucose, Bld: 210 mg/dL — ABNORMAL HIGH (ref 70–99)
Potassium: 4 mmol/L (ref 3.5–5.1)
Sodium: 135 mmol/L (ref 135–145)
Total Bilirubin: 0.6 mg/dL (ref 0.3–1.2)
Total Protein: 7.8 g/dL (ref 6.5–8.1)

## 2019-11-19 LAB — D-DIMER, QUANTITATIVE: D-Dimer, Quant: 0.42 ug/mL-FEU (ref 0.00–0.50)

## 2019-11-19 LAB — C-REACTIVE PROTEIN: CRP: 11.3 mg/dL — ABNORMAL HIGH (ref <1.0)

## 2019-11-19 NOTE — Plan of Care (Signed)
  Problem: Education: Goal: Knowledge of General Education information will improve Description: Including pain rating scale, medication(s)/side effects and non-pharmacologic comfort measures Outcome: Progressing   Problem: Health Behavior/Discharge Planning: Goal: Ability to manage health-related needs will improve Outcome: Progressing   Problem: Education: Goal: Knowledge of risk factors and measures for prevention of condition will improve Outcome: Progressing   Problem: Coping: Goal: Psychosocial and spiritual needs will be supported Outcome: Progressing   

## 2019-11-19 NOTE — ED Notes (Signed)
PT resting on stretcher rails up x 2 call light in hand. Pt VSS NAD PT remains a/o x 4 skin warm dry intact. Pt denies SI and remains calm and cooperative. PT continues on cardiac monitor and pulse Ox.

## 2019-11-19 NOTE — Progress Notes (Signed)
PROGRESS NOTE    Devin Jackson  GDJ:[id number] DOB: 03-04-1960 DOA: 11/18/2019 PCP: Patient, No Pcp Per    Brief Narrative:   Devin Jackson  is a 59 y.o. male, without significant past medical history not taking any daily medications, patient presents to ED secondary to complaints of fever, body ache, and shortness of breath, patient reports he has been feeling sick for 6 days, symptoms started last Sunday generalized body ache, cough and shortness of breath, as well he does report fevers, reports poor oral intake and no appetite, reports nausea, but no vomiting, no diarrhea, patient reports he is unvaccinated against Covid, he is unaware of any specific Covid contacts, he had been tested for Covid several days ago, but results are still pending. -In ED he was noted to be hypoxic 85% on room air, febrile 103.2, chest x-ray significant for multifocal opacity, COVID-19 positive, CRP elevated at 8.5, hemoglobin elevated at 17.2, platelets low at 90 3K, no leukocytosis, mild hyponatremia at 132, and creatinine elevated at 1.23, no baseline to compare, patient was started on steroid, [facility] hospitalist consulted to admit.   Assessment & Plan:   Active Problems:   Acute respiratory disease due to COVID-19 virus   Thrombocytopenia (HCC)   Polycythemia   Acute respiratory failure secondary to COVID-19 pneumonia -Currently on 2 L of oxygen, but still short of breath in conversation -Inflammatory markers currently trending up -Continue on remdesivir and intravenous steroids -Encouraged to lay in prone position -Continue pulmonary hygiene  Thrombocytopenia -Suspect this is related to viral illness -No signs of bleeding at this time, continue to monitor   DVT prophylaxis: enoxaparin (LOVENOX) injection 40 mg Start: 11/18/19 2000  Code Status: Full code Family Communication: updated patient wife Tammy Richwine 10/3 Disposition Plan: Status is: Inpatient  Remains inpatient appropriate because:IV  treatments appropriate due to intensity of illness or inability to take PO   Dispo: The patient is from: Home              Anticipated d/c is to: Home              Anticipated d/c date is: 1 day               Patient currently is not medically stable to d/c.   Consultants:     Procedures:     Antimicrobials:       Subjective: Overall he is feeling better.  Still short of breath with conversation.  Describe sporadic chest pain inside her chest when he coughs.  Objective: Vitals:   11/19/19 0830 11/19/19 0900 11/19/19 0930 11/19/19 1000  BP: (!) 138/91 (!) 158/112 134/60 125/70  Pulse: 69 86 64 65  Resp: (!) 25 17 (!) 22 18  Temp:      TempSrc:      SpO2: 94% 91% 91% 92%  Weight:      Height:        Intake/Output Summary (Last 24 hours) at 11/19/2019 1021 Last data filed at 11/19/2019 1012 Gross per 24 hour  Intake 580.83 ml  Output --  Net 580.83 ml   Filed Weights   11/18/19 1638  Weight: 77.1 kg    Examination:  General exam: Appears calm and comfortable  Respiratory system: Coarse breath sounds bilaterally.  Mild increased respiratory effort Cardiovascular system: S1 & S2 heard, RRR. No JVD, murmurs, rubs, gallops or clicks. No pedal edema. Gastrointestinal system: Abdomen is nondistended, soft and nontender. No organomegaly or masses felt. Normal bowel sounds heard.  Central nervous system: Alert and oriented. No focal neurological deficits. Extremities: Symmetric 5 x 5 power. Skin: No rashes, lesions or ulcers Psychiatry: Judgement and insight appear normal. Mood & affect appropriate.     Data Reviewed: I have personally reviewed following labs and imaging studies  CBC: Recent Labs  Lab 11/18/19 1711 11/19/19 0521  WBC 5.8 4.0  NEUTROABS 4.7 3.2  HGB 17.2* 17.3*  HCT 50.4 52.2*  MCV 85.6 85.9  PLT 93* 94*   Basic Metabolic Panel: Recent Labs  Lab 11/18/19 1711 11/19/19 0521  NA 132* 135  K 3.9 4.0  CL 100 103  CO2 22 21*  GLUCOSE  111* 210*  BUN 20 25*  CREATININE 1.23 1.06  CALCIUM 8.4* 8.8*   GFR: Estimated Creatinine Clearance: 74.1 mL/min (by C-G formula based on SCr of 1.06 mg/dL). Liver Function Tests: Recent Labs  Lab 11/18/19 1711 11/19/19 0521  AST 35 36  ALT 39 39  ALKPHOS 73 72  BILITOT 0.4 0.6  PROT 7.6 7.8  ALBUMIN 3.6 3.5   No results for input(s): LIPASE, AMYLASE in the last 168 hours. No results for input(s): AMMONIA in the last 168 hours. Coagulation Profile: No results for input(s): INR, PROTIME in the last 168 hours. Cardiac Enzymes: No results for input(s): CKTOTAL, CKMB, CKMBINDEX, TROPONINI in the last 168 hours. BNP (last 3 results) No results for input(s): PROBNP in the last 8760 hours. HbA1C: No results for input(s): HGBA1C in the last 72 hours. CBG: No results for input(s): GLUCAP in the last 168 hours. Lipid Profile: Recent Labs    11/18/19 1711  TRIG 200*   Thyroid Function Tests: No results for input(s): TSH, T4TOTAL, FREET4, T3FREE, THYROIDAB in the last 72 hours. Anemia Panel: Recent Labs    11/18/19 1711  FERRITIN 830*   Sepsis Labs: Recent Labs  Lab 11/18/19 1711 11/18/19 1932  PROCALCITON 0.15  --   LATICACIDVEN 1.0 0.9    Recent Results (from the past 240 hour(s))  Blood Culture (routine x 2)     Status: None (Preliminary result)   Collection Time: 11/18/19  5:11 PM   Specimen: BLOOD LEFT HAND  Result Value Ref Range Status   Specimen Description BLOOD LEFT HAND  Final   Special Requests   Final    BOTTLES DRAWN AEROBIC AND ANAEROBIC Blood Culture adequate volume Performed at [facility], [address], [place], [place] [address]    Culture PENDING  Incomplete   Report Status PENDING  Incomplete  Blood Culture (routine x 2)     Status: None (Preliminary result)   Collection Time: 11/18/19  5:31 PM   Specimen: BLOOD RIGHT ARM  Result Value Ref Range Status   Specimen Description BLOOD RIGHT ARM  Final   Special Requests   Final     BOTTLES DRAWN AEROBIC AND ANAEROBIC Blood Culture adequate volume Performed at [facility], [address] [place], [place] [address]    Culture PENDING  Incomplete   Report Status PENDING  Incomplete  Respiratory Panel by RT PCR (Flu A&B, Covid) - Nasopharyngeal Swab     Status: Abnormal   Collection Time: 11/18/19  5:33 PM   Specimen: Nasopharyngeal Swab  Result Value Ref Range Status   SARS Coronavirus 2 by RT PCR POSITIVE (A) NEGATIVE Final    Comment: RESULT CALLED TO, READ BACK BY AND VERIFIED WITH:  MARQ MONGOMERY @1855  11/18/19 BY JONEST (NOTE) SARS-CoV-2 target nucleic acids are DETECTED.  SARS-CoV-2 RNA is generally detectable in upper  respiratory specimens  during the acute phase of infection. Positive results are indicative of the presence of the identified virus, but do not rule out bacterial infection or co-infection with other pathogens not detected by the test. Clinical correlation with patient history and other diagnostic information is necessary to determine patient infection status. The expected result is Negative.  Fact Sheet for Patients:  https://www.moore.com/  Fact Sheet for Healthcare Providers: https://www.young.biz/  This test is not yet approved or cleared by the [place] FDA and  has been authorized for detection and/or diagnosis of SARS-CoV-2 by FDA under an Emergency Use Authorization (EUA).  This EUA will remain in effect (meaning this test ca n be used) for the duration of  the COVID-19 declaration under Section 564(b)(1) of the Act, 21 U.S.C. section 360bbb-3(b)(1), unless the authorization is terminated or revoked sooner.      Influenza A by PCR NEGATIVE NEGATIVE Final   Influenza B by PCR NEGATIVE NEGATIVE Final    Comment: (NOTE) The Xpert Xpress SARS-CoV-2/FLU/RSV assay is intended as an aid in  the diagnosis of influenza from Nasopharyngeal swab specimens and  should not be used as a sole  basis for treatment. Nasal washings and  aspirates are unacceptable for Xpert Xpress SARS-CoV-2/FLU/RSV  testing.  Fact Sheet for Patients: https://www.moore.com/  Fact Sheet for Healthcare Providers: https://www.young.biz/  This test is not yet approved or cleared by the [place] FDA and  has been authorized for detection and/or diagnosis of SARS-CoV-2 by  FDA under an Emergency Use Authorization (EUA). This EUA will remain  in effect (meaning this test can be used) for the duration of the  Covid-19 declaration under Section 564(b)(1) of the Act, 21  U.S.C. section 360bbb-3(b)(1), unless the authorization is  terminated or revoked. Performed at [facility], [address] [place], [place] [address]          Radiology Studies: [facility] Chest [facility] 1 View  Result Date: 11/18/2019 CLINICAL DATA:  Cough and shortness of breath. EXAM: PORTABLE CHEST 1 VIEW COMPARISON:  None. FINDINGS: Mild, diffuse, chronic appearing increased interstitial lung markings are seen. Mild areas of linear atelectasis are seen along the lateral aspect of the left lung base. There is no evidence of a pleural effusion or pneumothorax. The heart size and mediastinal contours are within normal limits. A radiopaque fusion plate and screws are seen overlying the lower cervical spine. Degenerative changes are noted throughout the thoracic spine. IMPRESSION: Chronic appearing increased interstitial lung markings with mild left basilar linear atelectasis. Electronically Signed   By: Aram Candela M.D.   On: 11/18/2019 17:38        Scheduled Meds: . enoxaparin (LOVENOX) injection  40 mg Subcutaneous Q24H  . methylPREDNISolone (SOLU-MEDROL) injection  1 mg/kg Intravenous Q12H   Followed by  . [START ON 11/21/2019] predniSONE  50 mg Oral Daily  . pantoprazole  40 mg Oral Daily  . sodium chloride flush  3 mL Intravenous Q12H   Continuous Infusions: . sodium chloride    .  sodium chloride 50 mL/hr at 11/19/19 1013  . remdesivir 100 mg in NS 100 mL 100 mg (11/19/19 1015)     LOS: 1 day    Time spent:    Erick Blinks, MD [facility]   If 7PM-7AM, please contact night-coverage [email or web address]  11/19/2019, 10:21 AM

## 2019-11-20 DIAGNOSIS — J9601 Acute respiratory failure with hypoxia: Secondary | ICD-10-CM

## 2019-11-20 DIAGNOSIS — U071 COVID-19: Principal | ICD-10-CM

## 2019-11-20 DIAGNOSIS — D696 Thrombocytopenia, unspecified: Secondary | ICD-10-CM

## 2019-11-20 DIAGNOSIS — J069 Acute upper respiratory infection, unspecified: Secondary | ICD-10-CM

## 2019-11-20 LAB — COMPREHENSIVE METABOLIC PANEL
ALT: 35 U/L (ref 0–44)
AST: 40 U/L (ref 15–41)
Albumin: 3 g/dL — ABNORMAL LOW (ref 3.5–5.0)
Alkaline Phosphatase: 60 U/L (ref 38–126)
Anion gap: 10 (ref 5–15)
BUN: 29 mg/dL — ABNORMAL HIGH (ref 6–20)
CO2: 22 mmol/L (ref 22–32)
Calcium: 8.6 mg/dL — ABNORMAL LOW (ref 8.9–10.3)
Chloride: 106 mmol/L (ref 98–111)
Creatinine, Ser: 1.03 mg/dL (ref 0.61–1.24)
GFR calc Af Amer: >60 mL/min (ref >60)
GFR calc non Af Amer: >60 mL/min (ref >60)
Glucose, Bld: 188 mg/dL — ABNORMAL HIGH (ref 70–99)
Potassium: 4.1 mmol/L (ref 3.5–5.1)
Sodium: 138 mmol/L (ref 135–145)
Total Bilirubin: 0.5 mg/dL (ref 0.3–1.2)
Total Protein: 6.5 g/dL (ref 6.5–8.1)

## 2019-11-20 LAB — CBC WITH DIFFERENTIAL/PLATELET
Abs Immature Granulocytes: 0.19 10*3/uL — ABNORMAL HIGH (ref 0.00–0.07)
Basophils Absolute: 0 10*3/uL (ref 0.0–0.1)
Basophils Relative: 0 %
Eosinophils Absolute: 0 10*3/uL (ref 0.0–0.5)
Eosinophils Relative: 0 %
HCT: 46.3 % (ref 39.0–52.0)
Hemoglobin: 15.7 g/dL (ref 13.0–17.0)
Immature Granulocytes: 2 %
Lymphocytes Relative: 5 %
Lymphs Abs: 0.6 10*3/uL — ABNORMAL LOW (ref 0.7–4.0)
MCH: 29 pg (ref 26.0–34.0)
MCHC: 33.9 g/dL (ref 30.0–36.0)
MCV: 85.6 fL (ref 80.0–100.0)
Monocytes Absolute: 0.6 10*3/uL (ref 0.1–1.0)
Monocytes Relative: 6 %
Neutro Abs: 9.8 10*3/uL — ABNORMAL HIGH (ref 1.7–7.7)
Neutrophils Relative %: 87 %
Platelets: 130 10*3/uL — ABNORMAL LOW (ref 150–400)
RBC: 5.41 MIL/uL (ref 4.22–5.81)
RDW: 13.8 % (ref 11.5–15.5)
WBC: 11.2 10*3/uL — ABNORMAL HIGH (ref 4.0–10.5)
nRBC: 0 % (ref 0.0–0.2)

## 2019-11-20 LAB — D-DIMER, QUANTITATIVE: D-Dimer, Quant: 0.33 ug/mL-FEU (ref 0.00–0.50)

## 2019-11-20 LAB — C-REACTIVE PROTEIN: CRP: 4 mg/dL — ABNORMAL HIGH (ref <1.0)

## 2019-11-20 MED ORDER — GUAIFENESIN-DM 100-10 MG/5ML PO SYRP: 10.0000 mL | ORAL_SOLUTION | ORAL | 0 refills | Status: AC | PRN

## 2019-11-20 MED ORDER — PREDNISONE 50 MG PO TABS: 50.0000 mg | ORAL_TABLET | Freq: Every day | ORAL | 0 refills | Status: DC

## 2019-11-20 MED ORDER — ALBUTEROL SULFATE HFA 108 (90 BASE) MCG/ACT IN AERS: 2.0000 | INHALATION_SPRAY | Freq: Four times a day (QID) | RESPIRATORY_TRACT | 2 refills | Status: AC | PRN

## 2019-11-20 MED ORDER — PANTOPRAZOLE SODIUM 40 MG PO TBEC
40.0000 mg | DELAYED_RELEASE_TABLET | Freq: Every day | ORAL | 0 refills | Status: DC
Start: 2019-11-21 — End: 2020-11-12

## 2019-11-20 NOTE — Discharge Instructions (Signed)
You will need to quarantine until 12/09/19   Patient scheduled for outpatient Remdesivir infusions at 9am on Tuesday 10/5 and Wednesday 10/6 at [facility]. Please inform the patient to park at [address], [place], as staff will be escorting the patient through the east entrance of the hospital. Appointments take approximately 45 minutes.    There is a wave flag banner located near the entrance on [address] Turn into this entrance and immediately turn left or right, and park in 1 of the 10 designated Covid Infusion Parking spots. There is a phone number on the sign, please call and let the staff know what spot you are in and we will come out and get you. For questions call [phone].  Thanks.    COVID-19 COVID-19 is a respiratory infection that is caused by a virus called severe acute respiratory syndrome coronavirus 2 (SARS-CoV-2). The disease is also known as coronavirus disease or novel coronavirus. In some people, the virus may not cause any symptoms. In others, it may cause a serious infection. The infection can get worse quickly and can lead to complications, such as:  Pneumonia, or infection of the lungs.  Acute respiratory distress syndrome or ARDS. This is a condition in which fluid build-up in the lungs prevents the lungs from filling with air and passing oxygen into the blood.  Acute respiratory failure. This is a condition in which there is not enough oxygen passing from the lungs to the body or when carbon dioxide is not passing from the lungs out of the body.  Sepsis or septic shock. This is a serious bodily reaction to an infection.  Blood clotting problems.  Secondary infections due to bacteria or fungus.  Organ failure. This is when your body's organs stop working. The virus that causes COVID-19 is contagious. This means that it can spread from person to person through droplets from coughs and sneezes (respiratory secretions). What are the  causes? This illness is caused by a virus. You may catch the virus by:  Breathing in droplets from an infected person. Droplets can be spread by a person breathing, speaking, singing, coughing, or sneezing.  Touching something, like a table or a doorknob, that was exposed to the virus (contaminated) and then touching your mouth, nose, or eyes. What increases the risk? Risk for infection You are more likely to be infected with this virus if you:  Are within 6 feet (2 meters) of a person with COVID-19.  Provide care for or live with a person who is infected with COVID-19.  Spend time in crowded indoor spaces or live in shared housing. Risk for serious illness You are more likely to become seriously ill from the virus if you:  Are 650 years of age or older. The higher your age, the more you are at risk for serious illness.  Live in a nursing home or long-term care facility.  Have cancer.  Have a long-term (chronic) disease such as: ? Chronic lung disease, including chronic obstructive pulmonary disease or asthma. ? A long-term disease that lowers your body's ability to fight infection (immunocompromised). ? Heart disease, including heart failure, a condition in which the arteries that lead to the heart become narrow or blocked (coronary artery disease), a disease which makes the heart muscle thick, weak, or stiff (cardiomyopathy). ? Diabetes. ? Chronic kidney disease. ? Sickle cell disease, a condition in which red blood cells have an abnormal "sickle" shape. ? Liver disease.  Are obese. What  are the signs or symptoms? Symptoms of this condition can range from mild to severe. Symptoms may appear any time from 2 to 14 days after being exposed to the virus. They include:  A fever or chills.  A cough.  Difficulty breathing.  Headaches, body aches, or muscle aches.  Runny or stuffy (congested) nose.  A sore throat.  New loss of taste or smell. Some people may also have  stomach problems, such as nausea, vomiting, or diarrhea. Other people may not have any symptoms of COVID-19. How is this diagnosed? This condition may be diagnosed based on:  Your signs and symptoms, especially if: ? You live in an area with a COVID-19 outbreak. ? You recently traveled to or from an area where the virus is common. ? You provide care for or live with a person who was diagnosed with COVID-19. ? You were exposed to a person who was diagnosed with COVID-19.  A physical exam.  Lab tests, which may include: ? Taking a sample of fluid from the back of your nose and throat (nasopharyngeal fluid), your nose, or your throat using a swab. ? A sample of mucus from your lungs (sputum). ? Blood tests.  Imaging tests, which may include, X-rays, CT scan, or ultrasound. How is this treated? At present, there is no medicine to treat COVID-19. Medicines that treat other diseases are being used on a trial basis to see if they are effective against COVID-19. Your health care provider will talk with you about ways to treat your symptoms. For most people, the infection is mild and can be managed at home with rest, fluids, and over-the-counter medicines. Treatment for a serious infection usually takes places in a hospital intensive care unit (ICU). It may include one or more of the following treatments. These treatments are given until your symptoms improve.  Receiving fluids and medicines through an IV.  Supplemental oxygen. Extra oxygen is given through a tube in the nose, a face mask, or a hood.  Positioning you to lie on your stomach (prone position). This makes it easier for oxygen to get into the lungs.  Continuous positive airway pressure (CPAP) or bi-level positive airway pressure (BPAP) machine. This treatment uses mild air pressure to keep the airways open. A tube that is connected to a motor delivers oxygen to the body.  Ventilator. This treatment moves air into and out of the  lungs by using a tube that is placed in your windpipe.  Tracheostomy. This is a procedure to create a hole in the neck so that a breathing tube can be inserted.  Extracorporeal membrane oxygenation (ECMO). This procedure gives the lungs a chance to recover by taking over the functions of the heart and lungs. It supplies oxygen to the body and removes carbon dioxide. Follow these instructions at home: Lifestyle  If you are sick, stay home except to get medical care. Your health care provider will tell you how long to stay home. Call your health care provider before you go for medical care.  Rest at home as told by your health care provider.  Do not use any products that contain nicotine or tobacco, such as cigarettes, e-cigarettes, and chewing tobacco. If you need help quitting, ask your health care provider.  Return to your normal activities as told by your health care provider. Ask your health care provider what activities are safe for you. General instructions  Take over-the-counter and prescription medicines only as told by your health care provider.  Drink enough fluid to keep your urine pale yellow.  Keep all follow-up visits as told by your health care provider. This is important. How is this prevented?  There is no vaccine to help prevent COVID-19 infection. However, there are steps you can take to protect yourself and others from this virus. To protect yourself:   Do not travel to areas where COVID-19 is a risk. The areas where COVID-19 is reported change often. To identify high-risk areas and travel restrictions, check the CDC travel website: StageSync.si  If you live in, or must travel to, an area where COVID-19 is a risk, take precautions to avoid infection. ? Stay away from people who are sick. ? Wash your hands often with soap and water for 20 seconds. If soap and water are not available, use an alcohol-based hand sanitizer. ? Avoid touching your mouth,  face, eyes, or nose. ? Avoid going out in public, follow guidance from your state and local health authorities. ? If you must go out in public, wear a cloth face covering or face mask. Make sure your mask covers your nose and mouth. ? Avoid crowded indoor spaces. Stay at least 6 feet (2 meters) away from others. ? Disinfect objects and surfaces that are frequently touched every day. This may include:  Counters and tables.  Doorknobs and light switches.  Sinks and faucets.  Electronics, such as phones, remote controls, keyboards, computers, and tablets. To protect others: If you have symptoms of COVID-19, take steps to prevent the virus from spreading to others.  If you think you have a COVID-19 infection, contact your health care provider right away. Tell your health care team that you think you may have a COVID-19 infection.  Stay home. Leave your house only to seek medical care. Do not use public transport.  Do not travel while you are sick.  Wash your hands often with soap and water for 20 seconds. If soap and water are not available, use alcohol-based hand sanitizer.  Stay away from other members of your household. Let healthy household members care for children and pets, if possible. If you have to care for children or pets, wash your hands often and wear a mask. If possible, stay in your own room, separate from others. Use a different bathroom.  Make sure that all people in your household wash their hands well and often.  Cough or sneeze into a tissue or your sleeve or elbow. Do not cough or sneeze into your hand or into the air.  Wear a cloth face covering or face mask. Make sure your mask covers your nose and mouth. Where to find more information  Centers for Disease Control and Prevention: StickerEmporium.tn  World Health Organization: https://thompson-craig.com/ Contact a health care provider if:  You live in or have traveled to an  area where COVID-19 is a risk and you have symptoms of the infection.  You have had contact with someone who has COVID-19 and you have symptoms of the infection. Get help right away if:  You have trouble breathing.  You have pain or pressure in your chest.  You have confusion.  You have bluish lips and fingernails.  You have difficulty waking from sleep.  You have symptoms that get worse. These symptoms may represent a serious problem that is an emergency. Do not wait to see if the symptoms will go away. Get medical help right away. Call your local emergency services (911 in the [place] Do not drive yourself to the hospital.  Let the emergency medical personnel know if you think you have COVID-19. Summary  COVID-19 is a respiratory infection that is caused by a virus. It is also known as coronavirus disease or novel coronavirus. It can cause serious infections, such as pneumonia, acute respiratory distress syndrome, acute respiratory failure, or sepsis.  The virus that causes COVID-19 is contagious. This means that it can spread from person to person through droplets from breathing, speaking, singing, coughing, or sneezing.  You are more likely to develop a serious illness if you are 12 years of age or older, have a weak immune system, live in a nursing home, or have chronic disease.  There is no medicine to treat COVID-19. Your health care provider will talk with you about ways to treat your symptoms.  Take steps to protect yourself and others from infection. Wash your hands often and disinfect objects and surfaces that are frequently touched every day. Stay away from people who are sick and wear a mask if you are sick. This information is not intended to replace advice given to you by your health care provider. Make sure you discuss any questions you have with your health care provider. Document Revised: 12/02/2018 Document Reviewed: 03/10/2018 Elsevier Patient Education  2020 [employer].

## 2019-11-20 NOTE — TOC Transition Note (Signed)
Transition of Care [facility]) - CM/SW Discharge Note   Patient Details  Name: Devin Jackson MRN: [id number] Date of Birth: Jan 24, 1961  [facility] CM/SW Contact:  Annice Needy, LCSW Phone Number: 11/20/2019, 4:15 PM   Clinical Narrative:    Patient from home with spouse, admitted for COVID+. Needs home oxygen at discharge. Uninsured. [employer] provided charity oxygen to patient.    Final next level of care: Home/Self Care Barriers to Discharge: No Barriers Identified   Patient Goals and CMS Choice Patient states their goals for this hospitalization and ongoing recovery are:: return home      Discharge Placement                       Discharge Plan and Services     Post Acute Care Choice: Durable Medical Equipment          DME Arranged: Oxygen DME Agency: [employer] Date DME Agency Contacted: 11/20/19 Time DME Agency Contacted: [phone] Representative spoke with at DME Agency: [employer]            Social Determinants of Health (SDOH) Interventions     Readmission Risk Interventions No flowsheet data found.

## 2019-11-20 NOTE — Progress Notes (Deleted)
Physician Discharge Summary  Devin Jackson ZOX:[id number] DOB: [birth year] DOA: 11/18/2019  PCP: Patient, No Pcp Per  Admit date: 11/18/2019 Discharge date: 11/20/2019  Admitted From: Home Disposition: Home  Recommendations for Outpatient Follow-up:  1. Follow up with PCP in 1-2 weeks 2. Please obtain BMP/CBC in one week 3. Patient has been set up with remdesivir clinic to complete outpatient dosing 4. He has been advised that he needs to quarantine for 21 days  Home Health: Equipment/Devices: Oxygen at 2 L  Discharge Condition: Stable CODE STATUS: Full code Diet recommendation: Heart healthy  Brief/Interim Summary: [name] a59 y.o.male,without significant past medical history not taking any daily medications, patient presents to ED secondary to complaints of fever, body ache, and shortness of breath, patient reports he has been feeling sick for 6 days, symptoms started last Sunday generalized body ache, cough and shortness of breath, as well he does report fevers, reports poor oral intake and no appetite, reports nausea, but no vomiting, no diarrhea, patient reports he is unvaccinated against Covid, he is unaware of any specific Covid contacts, he had been tested for Covid several days ago, but results are still pending. -In ED he was noted to be hypoxic 85% on room air, febrile 103.2, chest x-ray significant for multifocal opacity, COVID-19 positive, CRP elevated at 8.5, hemoglobin elevated at 17.2, platelets low at 90 3K, no leukocytosis, mild hyponatremia at 132, and creatinine elevated at 1.23, no baseline to compare,patient was started on steroid, [facility] consulted to admit.  Discharge Diagnoses:  Active Problems:   Acute respiratory disease due to COVID-19 virus   Thrombocytopenia (HCC)   Polycythemia  Acute respiratory failure secondary to COVID-19 pneumonia -Treated with remdesivir, steroids and supplemental oxygen -Overall inflammatory markers are trending  down -Remainder of remdesivir dosings have been set up as an outpatient -Steroids have been transitioned to prednisone taper -Patient was ambulated and was able to move comfortably on 2 L of oxygen -He will be discharged with supplemental oxygen -He has been advised that he needs to quarantine for total of 21 days  Thrombocytopenia -Suspect this is related to viral illness -No signs of bleeding at this time, continue to monitor  Discharge Instructions  Discharge Instructions    Diet - low sodium heart healthy   Complete by: As directed    Increase activity slowly   Complete by: As directed      Allergies as of 11/20/2019      Reactions   Morphine And Related Nausea And Vomiting      Medication List    TAKE these medications   albuterol 108 (90 Base) MCG/ACT inhaler Commonly known as: VENTOLIN HFA Inhale 2 puffs into the lungs every 6 (six) hours as needed for wheezing or shortness of breath.   guaiFENesin-dextromethorphan 100-10 MG/5ML syrup Commonly known as: ROBITUSSIN DM Take 10 mLs by mouth every 4 (four) hours as needed for cough.   pantoprazole 40 MG tablet Commonly known as: PROTONIX Take 1 tablet (40 mg total) by mouth daily. Start taking on: November 21, 2019   predniSONE 50 MG tablet Commonly known as: DELTASONE Take 1 tablet (50 mg total) by mouth daily.            Durable Medical Equipment  (From admission, onward)         Start     Ordered   11/20/19 1017  For home use only DME oxygen  Once       Question Answer Comment  Length of Need  6 Months   Mode or (Route) Nasal cannula   Liters per Minute 2   Frequency Continuous (stationary and portable oxygen unit needed)   Oxygen conserving device Yes   Oxygen delivery system Gas      11/20/19 1017          Allergies  Allergen Reactions  . Morphine And Related Nausea And Vomiting    Consultations:     Procedures/Studies: DG Chest Port 1 View  Result Date: 11/18/2019 CLINICAL  DATA:  Cough and shortness of breath. EXAM: PORTABLE CHEST 1 VIEW COMPARISON:  None. FINDINGS: Mild, diffuse, chronic appearing increased interstitial lung markings are seen. Mild areas of linear atelectasis are seen along the lateral aspect of the left lung base. There is no evidence of a pleural effusion or pneumothorax. The heart size and mediastinal contours are within normal limits. A radiopaque fusion plate and screws are seen overlying the lower cervical spine. Degenerative changes are noted throughout the thoracic spine. IMPRESSION: Chronic appearing increased interstitial lung markings with mild left basilar linear atelectasis. Electronically Signed   By: Aram Candela M.D.   On: 11/18/2019 17:38       Subjective: Patient is feeling better.  Able to ambulate on oxygen without difficulty.  Discharge Exam: Vitals:   11/19/19 2049 11/20/19 0233 11/20/19 0532 11/20/19 1319  BP: 119/72 113/62 110/77 121/70  Pulse: 73 63 60 69  Resp: 20 19 18 20   Temp: 98.2 F (36.8 C) 98.5 F (36.9 C) 97.9 F (36.6 C) 98.4 F (36.9 C)  TempSrc: Oral Oral Oral   SpO2: 96% 92% 93% 94%  Weight:      Height:        General: Pt is alert, awake, not in acute distress Cardiovascular: RRR, S1/S2 +, no rubs, no gallops Respiratory: CTA bilaterally, no wheezing, no rhonchi Abdominal: Soft, NT, ND, bowel sounds + Extremities: no edema, no cyanosis    The results of significant diagnostics from this hospitalization (including imaging, microbiology, ancillary and laboratory) are listed below for reference.     Microbiology: Recent Results (from the past 240 hour(s))  Blood Culture (routine x 2)     Status: None (Preliminary result)   Collection Time: 11/18/19  5:11 PM   Specimen: BLOOD LEFT HAND  Result Value Ref Range Status   Specimen Description BLOOD LEFT HAND  Final   Special Requests   Final    BOTTLES DRAWN AEROBIC AND ANAEROBIC Blood Culture adequate volume   Culture   Final    NO  GROWTH 2 DAYS Performed at [facility], [address] [place], [place]    Report Status PENDING  Incomplete  Blood Culture (routine x 2)     Status: None (Preliminary result)   Collection Time: 11/18/19  5:31 PM   Specimen: BLOOD RIGHT ARM  Result Value Ref Range Status   Specimen Description BLOOD RIGHT ARM  Final   Special Requests   Final    BOTTLES DRAWN AEROBIC AND ANAEROBIC Blood Culture adequate volume   Culture   Final    NO GROWTH 2 DAYS Performed at [facility], [address], [place], [place]    Report Status PENDING  Incomplete  Respiratory Panel by RT PCR (Flu A&B, Covid) - Nasopharyngeal Swab     Status: Abnormal   Collection Time: 11/18/19  5:33 PM   Specimen: Nasopharyngeal Swab  Result Value Ref Range Status   SARS Coronavirus 2 by RT PCR POSITIVE (A) NEGATIVE Final  Comment: RESULT CALLED TO, READ BACK BY AND VERIFIED WITH:  MARQ MONGOMERY @1855  11/18/19 BY JONEST (NOTE) SARS-CoV-2 target nucleic acids are DETECTED.  SARS-CoV-2 RNA is generally detectable in upper respiratory specimens  during the acute phase of infection. Positive results are indicative of the presence of the identified virus, but do not rule out bacterial infection or co-infection with other pathogens not detected by the test. Clinical correlation with patient history and other diagnostic information is necessary to determine patient infection status. The expected result is Negative.  Fact Sheet for Patients:  01/18/20  Fact Sheet for Healthcare Providers: https://www.moore.com/  This test is not yet approved or cleared by the https://www.young.biz/ FDA and  has been authorized for detection and/or diagnosis of SARS-CoV-2 by FDA under an Emergency Use Authorization (EUA).  This EUA will remain in effect (meaning this test ca n be used) for the duration of  the COVID-19 declaration under Section 564(b)(1) of the Act,  21 U.S.C. section 360bbb-3(b)(1), unless the authorization is terminated or revoked sooner.      Influenza A by PCR NEGATIVE NEGATIVE Final   Influenza B by PCR NEGATIVE NEGATIVE Final    Comment: (NOTE) The Xpert Xpress SARS-CoV-2/FLU/RSV assay is intended as an aid in  the diagnosis of influenza from Nasopharyngeal swab specimens and  should not be used as a sole basis for treatment. Nasal washings and  aspirates are unacceptable for Xpert Xpress SARS-CoV-2/FLU/RSV  testing.  Fact Sheet for Patients: [place]  Fact Sheet for Healthcare Providers: https://www.moore.com/  This test is not yet approved or cleared by the https://www.young.biz/ FDA and  has been authorized for detection and/or diagnosis of SARS-CoV-2 by  FDA under an Emergency Use Authorization (EUA). This EUA will remain  in effect (meaning this test can be used) for the duration of the  Covid-19 declaration under Section 564(b)(1) of the Act, 21  U.S.C. section 360bbb-3(b)(1), unless the authorization is  terminated or revoked. Performed at [facility], [address], [place], [place]      Labs: BNP (last 3 results) No results for input(s): BNP in the last 8760 hours. Basic Metabolic Panel: Recent Labs  Lab 11/18/19 1711 11/19/19 0521 11/20/19 0650  NA 132* 135 138  K 3.9 4.0 4.1  CL 100 103 106  CO2 22 21* 22  GLUCOSE 111* 210* 188*  BUN 20 25* 29*  CREATININE 1.23 1.06 1.03  CALCIUM 8.4* 8.8* 8.6*   Liver Function Tests: Recent Labs  Lab 11/18/19 1711 11/19/19 0521 11/20/19 0650  AST 35 36 40  ALT 39 39 35  ALKPHOS 73 72 60  BILITOT 0.4 0.6 0.5  PROT 7.6 7.8 6.5  ALBUMIN 3.6 3.5 3.0*   No results for input(s): LIPASE, AMYLASE in the last 168 hours. No results for input(s): AMMONIA in the last 168 hours. CBC: Recent Labs  Lab 11/18/19 1711 11/19/19 0521 11/20/19 0650  WBC 5.8 4.0 11.2*  NEUTROABS 4.7 3.2 9.8*  HGB 17.2*  17.3* 15.7  HCT 50.4 52.2* 46.3  MCV 85.6 85.9 85.6  PLT 93* 94* 130*   Cardiac Enzymes: No results for input(s): CKTOTAL, CKMB, CKMBINDEX, TROPONINI in the last 168 hours. BNP: Invalid input(s): POCBNP CBG: No results for input(s): GLUCAP in the last 168 hours. D-Dimer Recent Labs    11/19/19 0521 11/20/19 0650  DDIMER 0.42 0.33   Hgb A1c No results for input(s): HGBA1C in the last 72 hours. Lipid Profile Recent Labs    11/18/19 1711  TRIG 200*   Thyroid function studies No results for input(s): TSH, T4TOTAL, T3FREE, THYROIDAB in the last 72 hours.  Invalid input(s): FREET3 Anemia work up Recent Labs    11/18/19 1711  FERRITIN 830*   Urinalysis No results found for: COLORURINE, APPEARANCEUR, LABSPEC, PHURINE, GLUCOSEU, HGBUR, BILIRUBINUR, KETONESUR, PROTEINUR, UROBILINOGEN, NITRITE, LEUKOCYTESUR Sepsis Labs Invalid input(s): PROCALCITONIN,  WBC,  LACTICIDVEN Microbiology Recent Results (from the past 240 hour(s))  Blood Culture (routine x 2)     Status: None (Preliminary result)   Collection Time: 11/18/19  5:11 PM   Specimen: BLOOD LEFT HAND  Result Value Ref Range Status   Specimen Description BLOOD LEFT HAND  Final   Special Requests   Final    BOTTLES DRAWN AEROBIC AND ANAEROBIC Blood Culture adequate volume   Culture   Final    NO GROWTH 2 DAYS Performed at [facility], [address] [place], [place] [address]    Report Status PENDING  Incomplete  Blood Culture (routine x 2)     Status: None (Preliminary result)   Collection Time: 11/18/19  5:31 PM   Specimen: BLOOD RIGHT ARM  Result Value Ref Range Status   Specimen Description BLOOD RIGHT ARM  Final   Special Requests   Final    BOTTLES DRAWN AEROBIC AND ANAEROBIC Blood Culture adequate volume   Culture   Final    NO GROWTH 2 DAYS Performed at [facility], [address] [place], [place] [address]    Report Status PENDING  Incomplete  Respiratory Panel by RT PCR (Flu A&B, Covid) -  Nasopharyngeal Swab     Status: Abnormal   Collection Time: 11/18/19  5:33 PM   Specimen: Nasopharyngeal Swab  Result Value Ref Range Status   SARS Coronavirus 2 by RT PCR POSITIVE (A) NEGATIVE Final    Comment: RESULT CALLED TO, READ BACK BY AND VERIFIED WITH:  MARQ MONGOMERY @1855  11/18/19 BY JONEST (NOTE) SARS-CoV-2 target nucleic acids are DETECTED.  SARS-CoV-2 RNA is generally detectable in upper respiratory specimens  during the acute phase of infection. Positive results are indicative of the presence of the identified virus, but do not rule out bacterial infection or co-infection with other pathogens not detected by the test. Clinical correlation with patient history and other diagnostic information is necessary to determine patient infection status. The expected result is Negative.  Fact Sheet for Patients:  01/18/20  Fact Sheet for Healthcare Providers: https://www.moore.com/  This test is not yet approved or cleared by the https://www.young.biz/ FDA and  has been authorized for detection and/or diagnosis of SARS-CoV-2 by FDA under an Emergency Use Authorization (EUA).  This EUA will remain in effect (meaning this test ca n be used) for the duration of  the COVID-19 declaration under Section 564(b)(1) of the Act, 21 U.S.C. section 360bbb-3(b)(1), unless the authorization is terminated or revoked sooner.      Influenza A by PCR NEGATIVE NEGATIVE Final   Influenza B by PCR NEGATIVE NEGATIVE Final    Comment: (NOTE) The Xpert Xpress SARS-CoV-2/FLU/RSV assay is intended as an aid in  the diagnosis of influenza from Nasopharyngeal swab specimens and  should not be used as a sole basis for treatment. Nasal washings and  aspirates are unacceptable for Xpert Xpress SARS-CoV-2/FLU/RSV  testing.  Fact Sheet for Patients: [place]  Fact Sheet for Healthcare  Providers: https://www.moore.com/  This test is not yet approved or cleared by the https://www.young.biz/ FDA and  has been authorized for detection and/or diagnosis of  SARS-CoV-2 by  FDA under an Emergency Use Authorization (EUA). This EUA will remain  in effect (meaning this test can be used) for the duration of the  Covid-19 declaration under Section 564(b)(1) of the Act, 21  U.S.C. section 360bbb-3(b)(1), unless the authorization is  terminated or revoked. Performed at [facility], [address] [place], [place] [address]      Time coordinating discharge:  SIGNED:   Erick Blinks, MD  [facility] 11/20/2019, 8:43 PM   If 7PM-7AM, please contact night-coverage [email or web address]

## 2019-11-20 NOTE — Discharge Summary (Signed)
Physician Discharge Summary  Devin Jackson ZOX:[id number] DOB: 1960/05/27 DOA: 11/18/2019  PCP: Patient, No Pcp Per  Admit date: 11/18/2019 Discharge date: 11/20/2019  Admitted From: Home Disposition: Home  Recommendations for Outpatient Follow-up:  1. Follow up with PCP in 1-2 weeks 2. Please obtain BMP/CBC in one week 3. Patient has been set up with remdesivir clinic to complete outpatient dosing 4. He has been advised that he needs to quarantine for 21 days  Home Health: Equipment/Devices: Oxygen at 2 L  Discharge Condition: Stable CODE STATUS: Full code Diet recommendation: Heart healthy  Brief/Interim Summary: Devin Jackson a59 y.o.male,without significant past medical history not taking any daily medications, patient presents to ED secondary to complaints of fever, body ache, and shortness of breath, patient reports he has been feeling sick for 6 days, symptoms started last Sunday generalized body ache, cough and shortness of breath, as well he does report fevers, reports poor oral intake and no appetite, reports nausea, but no vomiting, no diarrhea, patient reports he is unvaccinated against Covid, he is unaware of any specific Covid contacts, he had been tested for Covid several days ago, but results are still pending. -In ED he was noted to be hypoxic 85% on room air, febrile 103.2, chest x-ray significant for multifocal opacity, COVID-19 positive, CRP elevated at 8.5, hemoglobin elevated at 17.2, platelets low at 90 3K, no leukocytosis, mild hyponatremia at 132, and creatinine elevated at 1.23, no baseline to compare,patient was started on steroid, [facility] consulted to admit.  Discharge Diagnoses:  Active Problems:   Acute respiratory disease due to COVID-19 virus   Thrombocytopenia (HCC)   Polycythemia  Acute respiratory failure secondary to COVID-19 pneumonia -Treated with remdesivir, steroids and supplemental oxygen -Overall inflammatory markers are trending  down -Remainder of remdesivir dosings have been set up as an outpatient -Steroids have been transitioned to prednisone taper -Patient was ambulated and was able to move comfortably on 2 L of oxygen -He will be discharged with supplemental oxygen -He has been advised that he needs to quarantine for total of 21 days  Thrombocytopenia -Suspect this is related to viral illness -No signs of bleeding at this time, continue to monitor  Discharge Instructions  Discharge Instructions    Diet - low sodium heart healthy   Complete by: As directed    Increase activity slowly   Complete by: As directed      Allergies as of 11/20/2019      Reactions   Morphine And Related Nausea And Vomiting      Medication List    TAKE these medications   albuterol 108 (90 Base) MCG/ACT inhaler Commonly known as: VENTOLIN HFA Inhale 2 puffs into the lungs every 6 (six) hours as needed for wheezing or shortness of breath.   guaiFENesin-dextromethorphan 100-10 MG/5ML syrup Commonly known as: ROBITUSSIN DM Take 10 mLs by mouth every 4 (four) hours as needed for cough.   pantoprazole 40 MG tablet Commonly known as: PROTONIX Take 1 tablet (40 mg total) by mouth daily. Start taking on: November 21, 2019   predniSONE 50 MG tablet Commonly known as: DELTASONE Take 1 tablet (50 mg total) by mouth daily.            Durable Medical Equipment  (From admission, onward)         Start     Ordered   11/20/19 1017  For home use only DME oxygen  Once       Question Answer Comment  Length of Need  6 Months   Mode or (Route) Nasal cannula   Liters per Minute 2   Frequency Continuous (stationary and portable oxygen unit needed)   Oxygen conserving device Yes   Oxygen delivery system Gas      11/20/19 1017          Allergies  Allergen Reactions  . Morphine And Related Nausea And Vomiting    Consultations:     Procedures/Studies: DG Chest Port 1 View  Result Date: 11/18/2019 CLINICAL  DATA:  Cough and shortness of breath. EXAM: PORTABLE CHEST 1 VIEW COMPARISON:  None. FINDINGS: Mild, diffuse, chronic appearing increased interstitial lung markings are seen. Mild areas of linear atelectasis are seen along the lateral aspect of the left lung base. There is no evidence of a pleural effusion or pneumothorax. The heart size and mediastinal contours are within normal limits. A radiopaque fusion plate and screws are seen overlying the lower cervical spine. Degenerative changes are noted throughout the thoracic spine. IMPRESSION: Chronic appearing increased interstitial lung markings with mild left basilar linear atelectasis. Electronically Signed   By: Aram Candela M.D.   On: 11/18/2019 17:38      Subjective: Patient is feeling better.  Able to ambulate on oxygen without difficulty.  Discharge Exam: Vitals:   11/19/19 2049 11/20/19 0233 11/20/19 0532 11/20/19 1319  BP: 119/72 113/62 110/77 121/70  Pulse: 73 63 60 69  Resp: 20 19 18 20   Temp: 98.2 F (36.8 C) 98.5 F (36.9 C) 97.9 F (36.6 C) 98.4 F (36.9 C)  TempSrc: Oral Oral Oral   SpO2: 96% 92% 93% 94%  Weight:      Height:        General: Pt is alert, awake, not in acute distress Cardiovascular: RRR, S1/S2 +, no rubs, no gallops Respiratory: CTA bilaterally, no wheezing, no rhonchi Abdominal: Soft, NT, ND, bowel sounds + Extremities: no edema, no cyanosis    The results of significant diagnostics from this hospitalization (including imaging, microbiology, ancillary and laboratory) are listed below for reference.     Microbiology: Recent Results (from the past 240 hour(s))  Blood Culture (routine x 2)     Status: None (Preliminary result)   Collection Time: 11/18/19  5:11 PM   Specimen: BLOOD LEFT HAND  Result Value Ref Range Status   Specimen Description BLOOD LEFT HAND  Final   Special Requests   Final    BOTTLES DRAWN AEROBIC AND ANAEROBIC Blood Culture adequate volume   Culture   Final    NO  GROWTH 2 DAYS Performed at [facility], [address], [place], [place]    Report Status PENDING  Incomplete  Blood Culture (routine x 2)     Status: None (Preliminary result)   Collection Time: 11/18/19  5:31 PM   Specimen: BLOOD RIGHT ARM  Result Value Ref Range Status   Specimen Description BLOOD RIGHT ARM  Final   Special Requests   Final    BOTTLES DRAWN AEROBIC AND ANAEROBIC Blood Culture adequate volume   Culture   Final    NO GROWTH 2 DAYS Performed at [facility], [address], [place], [place]    Report Status PENDING  Incomplete  Respiratory Panel by RT PCR (Flu A&B, Covid) - Nasopharyngeal Swab     Status: Abnormal   Collection Time: 11/18/19  5:33 PM   Specimen: Nasopharyngeal Swab  Result Value Ref Range Status   SARS Coronavirus 2 by RT PCR POSITIVE (A) NEGATIVE Final  Comment: RESULT CALLED TO, READ BACK BY AND VERIFIED WITH:  MARQ MONGOMERY @1855  11/18/19 BY JONEST (NOTE) SARS-CoV-2 target nucleic acids are DETECTED.  SARS-CoV-2 RNA is generally detectable in upper respiratory specimens  during the acute phase of infection. Positive results are indicative of the presence of the identified virus, but do not rule out bacterial infection or co-infection with other pathogens not detected by the test. Clinical correlation with patient history and other diagnostic information is necessary to determine patient infection status. The expected result is Negative.  Fact Sheet for Patients:  01/18/20  Fact Sheet for Healthcare Providers: https://www.moore.com/  This test is not yet approved or cleared by the https://www.young.biz/ FDA and  has been authorized for detection and/or diagnosis of SARS-CoV-2 by FDA under an Emergency Use Authorization (EUA).  This EUA will remain in effect (meaning this test ca n be used) for the duration of  the COVID-19 declaration under Section 564(b)(1) of the Act,  21 U.S.C. section 360bbb-3(b)(1), unless the authorization is terminated or revoked sooner.      Influenza A by PCR NEGATIVE NEGATIVE Final   Influenza B by PCR NEGATIVE NEGATIVE Final    Comment: (NOTE) The Xpert Xpress SARS-CoV-2/FLU/RSV assay is intended as an aid in  the diagnosis of influenza from Nasopharyngeal swab specimens and  should not be used as a sole basis for treatment. Nasal washings and  aspirates are unacceptable for Xpert Xpress SARS-CoV-2/FLU/RSV  testing.  Fact Sheet for Patients: [place]  Fact Sheet for Healthcare Providers: https://www.moore.com/  This test is not yet approved or cleared by the https://www.young.biz/ FDA and  has been authorized for detection and/or diagnosis of SARS-CoV-2 by  FDA under an Emergency Use Authorization (EUA). This EUA will remain  in effect (meaning this test can be used) for the duration of the  Covid-19 declaration under Section 564(b)(1) of the Act, 21  U.S.C. section 360bbb-3(b)(1), unless the authorization is  terminated or revoked. Performed at [facility], [address], [place], [place]      Labs: BNP (last 3 results) No results for input(s): BNP in the last 8760 hours. Basic Metabolic Panel: Recent Labs  Lab 11/18/19 1711 11/19/19 0521 11/20/19 0650  NA 132* 135 138  K 3.9 4.0 4.1  CL 100 103 106  CO2 22 21* 22  GLUCOSE 111* 210* 188*  BUN 20 25* 29*  CREATININE 1.23 1.06 1.03  CALCIUM 8.4* 8.8* 8.6*   Liver Function Tests: Recent Labs  Lab 11/18/19 1711 11/19/19 0521 11/20/19 0650  AST 35 36 40  ALT 39 39 35  ALKPHOS 73 72 60  BILITOT 0.4 0.6 0.5  PROT 7.6 7.8 6.5  ALBUMIN 3.6 3.5 3.0*   No results for input(s): LIPASE, AMYLASE in the last 168 hours. No results for input(s): AMMONIA in the last 168 hours. CBC: Recent Labs  Lab 11/18/19 1711 11/19/19 0521 11/20/19 0650  WBC 5.8 4.0 11.2*  NEUTROABS 4.7 3.2 9.8*  HGB 17.2*  17.3* 15.7  HCT 50.4 52.2* 46.3  MCV 85.6 85.9 85.6  PLT 93* 94* 130*   Cardiac Enzymes: No results for input(s): CKTOTAL, CKMB, CKMBINDEX, TROPONINI in the last 168 hours. BNP: Invalid input(s): POCBNP CBG: No results for input(s): GLUCAP in the last 168 hours. D-Dimer Recent Labs    11/19/19 0521 11/20/19 0650  DDIMER 0.42 0.33   Hgb A1c No results for input(s): HGBA1C in the last 72 hours. Lipid Profile Recent Labs    11/18/19 1711  TRIG 200*   Thyroid function studies No results for input(s): TSH, T4TOTAL, T3FREE, THYROIDAB in the last 72 hours.  Invalid input(s): FREET3 Anemia work up Recent Labs    11/18/19 1711  FERRITIN 830*   Urinalysis No results found for: COLORURINE, APPEARANCEUR, LABSPEC, PHURINE, GLUCOSEU, HGBUR, BILIRUBINUR, KETONESUR, PROTEINUR, UROBILINOGEN, NITRITE, LEUKOCYTESUR Sepsis Labs Invalid input(s): PROCALCITONIN,  WBC,  LACTICIDVEN Microbiology Recent Results (from the past 240 hour(s))  Blood Culture (routine x 2)     Status: None (Preliminary result)   Collection Time: 11/18/19  5:11 PM   Specimen: BLOOD LEFT HAND  Result Value Ref Range Status   Specimen Description BLOOD LEFT HAND  Final   Special Requests   Final    BOTTLES DRAWN AEROBIC AND ANAEROBIC Blood Culture adequate volume   Culture   Final    NO GROWTH 2 DAYS Performed at [facility], [address], [place], [place] [address]    Report Status PENDING  Incomplete  Blood Culture (routine x 2)     Status: None (Preliminary result)   Collection Time: 11/18/19  5:31 PM   Specimen: BLOOD RIGHT ARM  Result Value Ref Range Status   Specimen Description BLOOD RIGHT ARM  Final   Special Requests   Final    BOTTLES DRAWN AEROBIC AND ANAEROBIC Blood Culture adequate volume   Culture   Final    NO GROWTH 2 DAYS Performed at [facility], [address] [place], [place] [address]    Report Status PENDING  Incomplete  Respiratory Panel by RT PCR (Flu A&B, Covid) -  Nasopharyngeal Swab     Status: Abnormal   Collection Time: 11/18/19  5:33 PM   Specimen: Nasopharyngeal Swab  Result Value Ref Range Status   SARS Coronavirus 2 by RT PCR POSITIVE (A) NEGATIVE Final    Comment: RESULT CALLED TO, READ BACK BY AND VERIFIED WITH:  MARQ MONGOMERY @1855  11/18/19 BY JONEST (NOTE) SARS-CoV-2 target nucleic acids are DETECTED.  SARS-CoV-2 RNA is generally detectable in upper respiratory specimens  during the acute phase of infection. Positive results are indicative of the presence of the identified virus, but do not rule out bacterial infection or co-infection with other pathogens not detected by the test. Clinical correlation with patient history and other diagnostic information is necessary to determine patient infection status. The expected result is Negative.  Fact Sheet for Patients:  01/18/20  Fact Sheet for Healthcare Providers: https://www.moore.com/  This test is not yet approved or cleared by the https://www.young.biz/ FDA and  has been authorized for detection and/or diagnosis of SARS-CoV-2 by FDA under an Emergency Use Authorization (EUA).  This EUA will remain in effect (meaning this test ca n be used) for the duration of  the COVID-19 declaration under Section 564(b)(1) of the Act, 21 U.S.C. section 360bbb-3(b)(1), unless the authorization is terminated or revoked sooner.      Influenza A by PCR NEGATIVE NEGATIVE Final   Influenza B by PCR NEGATIVE NEGATIVE Final    Comment: (NOTE) The Xpert Xpress SARS-CoV-2/FLU/RSV assay is intended as an aid in  the diagnosis of influenza from Nasopharyngeal swab specimens and  should not be used as a sole basis for treatment. Nasal washings and  aspirates are unacceptable for Xpert Xpress SARS-CoV-2/FLU/RSV  testing.  Fact Sheet for Patients: [place]  Fact Sheet for Healthcare  Providers: https://www.moore.com/  This test is not yet approved or cleared by the https://www.young.biz/ FDA and  has been authorized for detection and/or diagnosis of  SARS-CoV-2 by  FDA under an Emergency Use Authorization (EUA). This EUA will remain  in effect (meaning this test can be used) for the duration of the  Covid-19 declaration under Section 564(b)(1) of the Act, 21  U.S.C. section 360bbb-3(b)(1), unless the authorization is  terminated or revoked. Performed at [facility], [address] [place], [place] [address]      Time coordinating discharge:  SIGNED:   Erick Blinks, MD  [facility] 11/20/2019, 8:46 PM   If 7PM-7AM, please contact night-coverage [email or web address]

## 2019-11-20 NOTE — Progress Notes (Signed)
SATURATION QUALIFICATIONS: (This note is used to comply with regulatory documentation for home oxygen)  Patient Saturations on Room Air at Rest = 89 %  Patient Saturations on Room Air while Ambulating = 87 %  Patient Saturations on 2 Liters of oxygen while Ambulating = 90 %  Please briefly explain why patient needs home oxygen: Patient experienced dyspnea with exertion with decreased oxygen saturations.

## 2019-11-20 NOTE — Progress Notes (Addendum)
Patient scheduled for outpatient Remdesivir infusions at 9am on Tuesday 10/5 and Wednesday 10/6 at [facility]. Please inform the patient to park at [address] [place], [place], as staff will be escorting the patient through the east entrance of the hospital. Appointments take approximately 45 minutes.    There is a wave flag banner located near the entrance on [address]. Turn into this entrance and immediately turn left or right, and park in 1 of the 10 designated Covid Infusion Parking spots. There is a phone number on the sign, please call and let the staff know what spot you are in and we will come out and get you. For questions call [phone].  Thanks.

## 2019-11-20 NOTE — Progress Notes (Signed)
Nsg Discharge Note  Admit Date:  11/18/2019 Discharge date: 11/20/2019   TRON FLYTHE to be D/C'd Home per MD order.  AVS completed.  Patient able to verbalize understanding.  Discharge Medication: Allergies as of 11/20/2019      Reactions   Morphine And Related Nausea And Vomiting      Medication List    TAKE these medications   albuterol 108 (90 Base) MCG/ACT inhaler Commonly known as: VENTOLIN HFA Inhale 2 puffs into the lungs every 6 (six) hours as needed for wheezing or shortness of breath.   guaiFENesin-dextromethorphan 100-10 MG/5ML syrup Commonly known as: ROBITUSSIN DM Take 10 mLs by mouth every 4 (four) hours as needed for cough.   pantoprazole 40 MG tablet Commonly known as: PROTONIX Take 1 tablet (40 mg total) by mouth daily. Start taking on: November 21, 2019   predniSONE 50 MG tablet Commonly known as: DELTASONE Take 1 tablet (50 mg total) by mouth daily.            Durable Medical Equipment  (From admission, onward)         Start     Ordered   11/20/19 1017  For home use only DME oxygen  Once       Question Answer Comment  Length of Need 6 Months   Mode or (Route) Nasal cannula   Liters per Minute 2   Frequency Continuous (stationary and portable oxygen unit needed)   Oxygen conserving device Yes   Oxygen delivery system Gas      11/20/19 1017          Discharge Assessment: Vitals:   11/20/19 0532 11/20/19 1319  BP: 110/77 121/70  Pulse: 60 69  Resp: 18 20  Temp: 97.9 F (36.6 C) 98.4 F (36.9 C)  SpO2: 93% 94%   Skin clean, dry and intact without evidence of skin break down, no evidence of skin tears noted. IV catheter discontinued intact. Site without signs and symptoms of complications - no redness or edema noted at insertion site, patient denies c/o pain - only slight tenderness at site.  Dressing with slight pressure applied.  D/c Instructions-Education: Discharge instructions given to patient with verbalized understanding. D/c  education completed with patient including follow up instructions, medication list, d/c activities limitations if indicated, with other d/c instructions as indicated by MD - patient able to verbalize understanding, all questions fully answered. Patient instructed to return to ED, call 911, or call MD for any changes in condition.  Patient escorted via WC, and D/C home via private auto.  Jethro Poling, RN 11/20/2019 3:49 PM

## 2019-11-20 NOTE — Progress Notes (Signed)
SATURATION QUALIFICATIONS: (This note is used to comply with regulatory documentation for home oxygen)  Patient Saturations on Room Air at Rest = 92 %  Patient Saturations on Room Air while Ambulating = 89 %  Patient Saturations on 2 Liters of oxygen while Ambulating = 91 %  Please briefly explain why patient needs home oxygen: Patient experienced dyspnea with exertion with decreased oxygen saturations.

## 2019-11-21 ENCOUNTER — Ambulatory Visit (HOSPITAL_COMMUNITY)
Admit: 2019-11-21 | Discharge: 2019-11-21 | Disposition: A | Discharge status: Home / Self Care | Payer: HRSA Program | Attending: Pulmonary Disease | Admitting: Pulmonary Disease

## 2019-11-21 MED ORDER — EPINEPHRINE 0.3 MG/0.3ML IJ SOAJ: 0.3000 mg | Freq: Once | INTRAMUSCULAR | Status: DC | PRN

## 2019-11-21 MED ORDER — DIPHENHYDRAMINE HCL 50 MG/ML IJ SOLN: 50.0000 mg | Freq: Once | INTRAMUSCULAR | Status: DC | PRN

## 2019-11-21 MED ORDER — FAMOTIDINE IN NACL 20-0.9 MG/50ML-% IV SOLN: 20.0000 mg | Freq: Once | INTRAVENOUS | Status: DC | PRN

## 2019-11-21 MED ORDER — ALBUTEROL SULFATE HFA 108 (90 BASE) MCG/ACT IN AERS: 2.0000 | INHALATION_SPRAY | Freq: Once | RESPIRATORY_TRACT | Status: DC | PRN

## 2019-11-21 MED ORDER — METHYLPREDNISOLONE SODIUM SUCC 125 MG IJ SOLR: 125.0000 mg | Freq: Once | INTRAMUSCULAR | Status: DC | PRN

## 2019-11-21 MED ORDER — SODIUM CHLORIDE 0.9 % IV SOLN: INTRAVENOUS | Status: DC | PRN

## 2019-11-21 MED ORDER — SODIUM CHLORIDE 0.9 % IV SOLN
100.0000 mg | Freq: Once | INTRAVENOUS | Status: AC
  Administered 2019-11-21: 100 mg via INTRAVENOUS

## 2019-11-21 NOTE — Progress Notes (Signed)
  Diagnosis: COVID-19  Physician: Shan Levans  Procedure: [facility] Infusion Clinic Med: remdesivir infusion - Provided patient with remdesivir fact sheet for patients, parents and caregivers prior to infusion.  Complications: No immediate complications noted.  Discharge: Discharged home   Devin Jackson 11/21/2019

## 2019-11-22 ENCOUNTER — Ambulatory Visit (HOSPITAL_COMMUNITY)
Admission: AD | Admit: 2019-11-22 | Discharge: 2019-11-22 | Disposition: A | Discharge status: Home / Self Care | Payer: HRSA Program | Source: Ambulatory Visit | Attending: Pulmonary Disease | Admitting: Pulmonary Disease

## 2019-11-22 DIAGNOSIS — U071 COVID-19: Secondary | ICD-10-CM | POA: Insufficient documentation

## 2019-11-22 DIAGNOSIS — J1289 Other viral pneumonia: Secondary | ICD-10-CM | POA: Diagnosis present

## 2019-11-22 MED ORDER — EPINEPHRINE 0.3 MG/0.3ML IJ SOAJ: 0.3000 mg | Freq: Once | INTRAMUSCULAR | Status: DC | PRN

## 2019-11-22 MED ORDER — DIPHENHYDRAMINE HCL 50 MG/ML IJ SOLN: 50.0000 mg | Freq: Once | INTRAMUSCULAR | Status: DC | PRN

## 2019-11-22 MED ORDER — METHYLPREDNISOLONE SODIUM SUCC 125 MG IJ SOLR: 125.0000 mg | Freq: Once | INTRAMUSCULAR | Status: DC | PRN

## 2019-11-22 MED ORDER — SODIUM CHLORIDE 0.9 % IV SOLN
100.0000 mg | Freq: Once | INTRAVENOUS | Status: AC
  Administered 2019-11-22: 100 mg via INTRAVENOUS
  Filled 2019-11-22: qty 20

## 2019-11-22 MED ORDER — ALBUTEROL SULFATE HFA 108 (90 BASE) MCG/ACT IN AERS: 2.0000 | INHALATION_SPRAY | Freq: Once | RESPIRATORY_TRACT | Status: DC | PRN

## 2019-11-22 MED ORDER — SODIUM CHLORIDE 0.9 % IV SOLN: INTRAVENOUS | Status: DC | PRN

## 2019-11-22 MED ORDER — FAMOTIDINE IN NACL 20-0.9 MG/50ML-% IV SOLN: 20.0000 mg | Freq: Once | INTRAVENOUS | Status: DC | PRN

## 2019-11-22 NOTE — Progress Notes (Signed)
  Diagnosis: COVID-19  Physician: Dr. Delford Field  Procedure: Cov[facility] Infusion Clinic Med: remdesivir infusion - Provided patient with remdesivir fact sheet for patients, parents and caregivers prior to infusion.  Complications: No immediate complications noted.  Discharge: Discharged home   Devin Jackson 11/22/2019

## 2019-11-23 LAB — CULTURE, BLOOD (ROUTINE X 2)
Culture: NO GROWTH
Culture: NO GROWTH
Special Requests: ADEQUATE
Special Requests: ADEQUATE

## 2020-11-11 ENCOUNTER — Encounter (HOSPITAL_COMMUNITY): Payer: Self-pay

## 2020-11-11 ENCOUNTER — Emergency Department (HOSPITAL_COMMUNITY): Payer: Medicaid Other

## 2020-11-11 ENCOUNTER — Other Ambulatory Visit: Payer: Self-pay

## 2020-11-11 ENCOUNTER — Observation Stay (HOSPITAL_COMMUNITY)
Admission: EM | Admit: 2020-11-11 | Discharge: 2020-11-12 | Disposition: A | Discharge status: Home / Self Care | Payer: Medicaid Other | Attending: Cardiology | Admitting: Cardiology

## 2020-11-11 ENCOUNTER — Encounter (HOSPITAL_COMMUNITY)
Admission: EM | Disposition: A | Discharge status: Home / Self Care | Payer: Self-pay | Source: Home / Self Care | Attending: Emergency Medicine

## 2020-11-11 ENCOUNTER — Observation Stay (HOSPITAL_COMMUNITY): Payer: Medicaid Other

## 2020-11-11 DIAGNOSIS — Z79899 Other long term (current) drug therapy: Secondary | ICD-10-CM | POA: Diagnosis not present

## 2020-11-11 DIAGNOSIS — I25110 Atherosclerotic heart disease of native coronary artery with unstable angina pectoris: Secondary | ICD-10-CM | POA: Insufficient documentation

## 2020-11-11 DIAGNOSIS — I10 Essential (primary) hypertension: Secondary | ICD-10-CM | POA: Diagnosis not present

## 2020-11-11 DIAGNOSIS — Z8249 Family history of ischemic heart disease and other diseases of the circulatory system: Secondary | ICD-10-CM

## 2020-11-11 DIAGNOSIS — Z20822 Contact with and (suspected) exposure to covid-19: Secondary | ICD-10-CM | POA: Insufficient documentation

## 2020-11-11 DIAGNOSIS — I214 Non-ST elevation (NSTEMI) myocardial infarction: Principal | ICD-10-CM

## 2020-11-11 DIAGNOSIS — I2510 Atherosclerotic heart disease of native coronary artery without angina pectoris: Secondary | ICD-10-CM

## 2020-11-11 DIAGNOSIS — E785 Hyperlipidemia, unspecified: Secondary | ICD-10-CM

## 2020-11-11 DIAGNOSIS — Z955 Presence of coronary angioplasty implant and graft: Secondary | ICD-10-CM

## 2020-11-11 DIAGNOSIS — R079 Chest pain, unspecified: Secondary | ICD-10-CM | POA: Diagnosis present

## 2020-11-11 HISTORY — PX: LEFT HEART CATH AND CORONARY ANGIOGRAPHY: CATH118249

## 2020-11-11 HISTORY — DX: Essential (primary) hypertension: I10

## 2020-11-11 HISTORY — PX: CORONARY STENT INTERVENTION: CATH118234

## 2020-11-11 LAB — CBC WITH DIFFERENTIAL/PLATELET
Abs Immature Granulocytes: 0.02 10*3/uL (ref 0.00–0.07)
Basophils Absolute: 0.1 10*3/uL (ref 0.0–0.1)
Basophils Relative: 1 %
Eosinophils Absolute: 0.1 10*3/uL (ref 0.0–0.5)
Eosinophils Relative: 2 %
HCT: 49.7 % (ref 39.0–52.0)
Hemoglobin: 16.8 g/dL (ref 13.0–17.0)
Immature Granulocytes: 0 %
Lymphocytes Relative: 24 %
Lymphs Abs: 1.3 10*3/uL (ref 0.7–4.0)
MCH: 29.5 pg (ref 26.0–34.0)
MCHC: 33.8 g/dL (ref 30.0–36.0)
MCV: 87.3 fL (ref 80.0–100.0)
Monocytes Absolute: 0.3 10*3/uL (ref 0.1–1.0)
Monocytes Relative: 6 %
Neutro Abs: 3.7 10*3/uL (ref 1.7–7.7)
Neutrophils Relative %: 67 %
Platelets: 201 10*3/uL (ref 150–400)
RBC: 5.69 MIL/uL (ref 4.22–5.81)
RDW: 13.7 % (ref 11.5–15.5)
WBC: 5.5 10*3/uL (ref 4.0–10.5)
nRBC: 0 % (ref 0.0–0.2)

## 2020-11-11 LAB — TROPONIN I (HIGH SENSITIVITY)
Troponin I (High Sensitivity): 92 ng/L — ABNORMAL HIGH (ref <18)
Troponin I (High Sensitivity): 86 ng/L — ABNORMAL HIGH (ref <18)

## 2020-11-11 LAB — BASIC METABOLIC PANEL
Anion gap: 9 (ref 5–15)
BUN: 16 mg/dL (ref 8–23)
CO2: 23 mmol/L (ref 22–32)
Calcium: 9.4 mg/dL (ref 8.9–10.3)
Chloride: 99 mmol/L (ref 98–111)
Creatinine, Ser: 1.32 mg/dL — ABNORMAL HIGH (ref 0.61–1.24)
GFR, Estimated: >60 mL/min (ref >60)
Glucose, Bld: 251 mg/dL — ABNORMAL HIGH (ref 70–99)
Potassium: 3.8 mmol/L (ref 3.5–5.1)
Sodium: 131 mmol/L — ABNORMAL LOW (ref 135–145)

## 2020-11-11 LAB — HEPARIN LEVEL (UNFRACTIONATED): Heparin Unfractionated: >1.1 IU/mL — ABNORMAL HIGH (ref 0.30–0.70)

## 2020-11-11 LAB — POCT ACTIVATED CLOTTING TIME
Activated Clotting Time: 277 seconds
Activated Clotting Time: 283 seconds
Activated Clotting Time: 300 seconds
Activated Clotting Time: 318 seconds

## 2020-11-11 LAB — RESP PANEL BY RT-PCR (FLU A&B, COVID) ARPGX2
Influenza A by PCR: NEGATIVE
Influenza B by PCR: NEGATIVE
SARS Coronavirus 2 by RT PCR: NEGATIVE

## 2020-11-11 SURGERY — LEFT HEART CATH AND CORONARY ANGIOGRAPHY
Anesthesia: LOCAL

## 2020-11-11 MED ORDER — ASPIRIN 81 MG PO CHEW
324.0000 mg | CHEWABLE_TABLET | Freq: Once | ORAL | Status: AC
  Administered 2020-11-11: 324 mg via ORAL
  Filled 2020-11-11: qty 4

## 2020-11-11 MED ORDER — SODIUM CHLORIDE 0.9 % IV SOLN: INTRAVENOUS | Status: AC

## 2020-11-11 MED ORDER — HEPARIN SODIUM (PORCINE) 1000 UNIT/ML IJ SOLN
INTRAMUSCULAR | Status: AC
  Filled 2020-11-11: qty 1

## 2020-11-11 MED ORDER — NITROGLYCERIN 1 MG/10 ML FOR IR/CATH LAB
INTRA_ARTERIAL | Status: AC
  Filled 2020-11-11: qty 10

## 2020-11-11 MED ORDER — TICAGRELOR 90 MG PO TABS
90.0000 mg | ORAL_TABLET | Freq: Two times a day (BID) | ORAL | Status: DC
  Administered 2020-11-12: 90 mg via ORAL
  Filled 2020-11-11: qty 1

## 2020-11-11 MED ORDER — MIDAZOLAM HCL 2 MG/2ML IJ SOLN
INTRAMUSCULAR | Status: DC | PRN
  Administered 2020-11-11 (×2): 1 mg via INTRAVENOUS

## 2020-11-11 MED ORDER — ATORVASTATIN CALCIUM 80 MG PO TABS
80.0000 mg | ORAL_TABLET | Freq: Every day | ORAL | Status: DC
  Administered 2020-11-12: 80 mg via ORAL
  Filled 2020-11-11: qty 2
  Filled 2020-11-11: qty 1

## 2020-11-11 MED ORDER — HEPARIN BOLUS VIA INFUSION
4000.0000 [IU] | Freq: Once | INTRAVENOUS | Status: AC
  Administered 2020-11-11: 4000 [IU] via INTRAVENOUS

## 2020-11-11 MED ORDER — HEPARIN (PORCINE) IN NACL 1000-0.9 UT/500ML-% IV SOLN
INTRAVENOUS | Status: DC | PRN
  Administered 2020-11-11 (×2): 500 mL

## 2020-11-11 MED ORDER — NITROGLYCERIN 0.4 MG SL SUBL
0.4000 mg | SUBLINGUAL_TABLET | SUBLINGUAL | Status: DC | PRN
  Filled 2020-11-11: qty 1

## 2020-11-11 MED ORDER — HEPARIN (PORCINE) 25000 UT/250ML-% IV SOLN
900.0000 [IU]/h | INTRAVENOUS | Status: DC
  Administered 2020-11-11: 900 [IU]/h via INTRAVENOUS
  Filled 2020-11-11: qty 250

## 2020-11-11 MED ORDER — ONDANSETRON HCL 4 MG/2ML IJ SOLN
INTRAMUSCULAR | Status: AC
  Filled 2020-11-11: qty 2

## 2020-11-11 MED ORDER — ONDANSETRON HCL 4 MG/2ML IJ SOLN
INTRAMUSCULAR | Status: DC | PRN
  Administered 2020-11-11: 4 mg via INTRAVENOUS

## 2020-11-11 MED ORDER — VERAPAMIL HCL 2.5 MG/ML IV SOLN
INTRAVENOUS | Status: DC | PRN
  Administered 2020-11-11: 10 mL via INTRA_ARTERIAL

## 2020-11-11 MED ORDER — SODIUM CHLORIDE 0.9% FLUSH
3.0000 mL | Freq: Two times a day (BID) | INTRAVENOUS | Status: DC
  Administered 2020-11-12: 3 mL via INTRAVENOUS

## 2020-11-11 MED ORDER — ACETAMINOPHEN 325 MG PO TABS: 650.0000 mg | ORAL_TABLET | ORAL | Status: DC | PRN

## 2020-11-11 MED ORDER — NITROGLYCERIN 0.4 MG SL SUBL: 0.4000 mg | SUBLINGUAL_TABLET | SUBLINGUAL | Status: DC | PRN

## 2020-11-11 MED ORDER — HEPARIN SODIUM (PORCINE) 1000 UNIT/ML IJ SOLN
INTRAMUSCULAR | Status: DC | PRN
  Administered 2020-11-11 (×2): 2000 [IU] via INTRAVENOUS
  Administered 2020-11-11: 4000 [IU] via INTRAVENOUS
  Administered 2020-11-11: 2000 [IU] via INTRAVENOUS
  Administered 2020-11-11: 4000 [IU] via INTRAVENOUS

## 2020-11-11 MED ORDER — LABETALOL HCL 5 MG/ML IV SOLN: 10.0000 mg | INTRAVENOUS | Status: AC | PRN

## 2020-11-11 MED ORDER — LIDOCAINE HCL (PF) 1 % IJ SOLN
INTRAMUSCULAR | Status: AC
  Filled 2020-11-11: qty 30

## 2020-11-11 MED ORDER — HYDRALAZINE HCL 20 MG/ML IJ SOLN: 10.0000 mg | INTRAMUSCULAR | Status: AC | PRN

## 2020-11-11 MED ORDER — METOPROLOL TARTRATE 25 MG PO TABS: 25.0000 mg | ORAL_TABLET | Freq: Two times a day (BID) | ORAL | Status: DC

## 2020-11-11 MED ORDER — ASPIRIN 81 MG PO CHEW
324.0000 mg | CHEWABLE_TABLET | ORAL | Status: DC
Start: 2020-11-11 — End: 2020-11-11

## 2020-11-11 MED ORDER — SODIUM CHLORIDE 0.9 % IV SOLN: 250.0000 mL | INTRAVENOUS | Status: DC | PRN

## 2020-11-11 MED ORDER — HEPARIN (PORCINE) IN NACL 1000-0.9 UT/500ML-% IV SOLN
INTRAVENOUS | Status: AC
  Filled 2020-11-11: qty 1000

## 2020-11-11 MED ORDER — FENTANYL CITRATE (PF) 100 MCG/2ML IJ SOLN
INTRAMUSCULAR | Status: DC | PRN
  Administered 2020-11-11: 50 ug via INTRAVENOUS
  Administered 2020-11-11: 25 ug via INTRAVENOUS

## 2020-11-11 MED ORDER — ASPIRIN 300 MG RE SUPP: 300.0000 mg | RECTAL | Status: DC

## 2020-11-11 MED ORDER — MIDAZOLAM HCL 2 MG/2ML IJ SOLN
INTRAMUSCULAR | Status: AC
  Filled 2020-11-11: qty 2

## 2020-11-11 MED ORDER — AMLODIPINE BESYLATE 5 MG PO TABS
5.0000 mg | ORAL_TABLET | Freq: Every day | ORAL | Status: DC
  Administered 2020-11-12: 5 mg via ORAL
  Filled 2020-11-11 (×2): qty 1

## 2020-11-11 MED ORDER — IOHEXOL 350 MG/ML SOLN
INTRAVENOUS | Status: DC | PRN
  Administered 2020-11-11: 195 mL

## 2020-11-11 MED ORDER — TICAGRELOR 90 MG PO TABS
ORAL_TABLET | ORAL | Status: AC
  Filled 2020-11-11: qty 2

## 2020-11-11 MED ORDER — ASPIRIN EC 81 MG PO TBEC
81.0000 mg | DELAYED_RELEASE_TABLET | Freq: Every day | ORAL | Status: DC
Start: 2020-11-12 — End: 2020-11-12
  Administered 2020-11-12: 81 mg via ORAL
  Filled 2020-11-11: qty 1

## 2020-11-11 MED ORDER — VERAPAMIL HCL 2.5 MG/ML IV SOLN
INTRAVENOUS | Status: AC
  Filled 2020-11-11: qty 2

## 2020-11-11 MED ORDER — ENOXAPARIN SODIUM 40 MG/0.4ML IJ SOSY
40.0000 mg | PREFILLED_SYRINGE | INTRAMUSCULAR | Status: DC
  Administered 2020-11-12: 40 mg via SUBCUTANEOUS
  Filled 2020-11-11: qty 0.4

## 2020-11-11 MED ORDER — ONDANSETRON HCL 4 MG/2ML IJ SOLN: 4.0000 mg | Freq: Four times a day (QID) | INTRAMUSCULAR | Status: DC | PRN

## 2020-11-11 MED ORDER — FENTANYL CITRATE (PF) 100 MCG/2ML IJ SOLN
INTRAMUSCULAR | Status: AC
  Filled 2020-11-11: qty 2

## 2020-11-11 MED ORDER — TICAGRELOR 90 MG PO TABS
ORAL_TABLET | ORAL | Status: DC | PRN
  Administered 2020-11-11: 180 mg via ORAL

## 2020-11-11 MED ORDER — SODIUM CHLORIDE 0.9 % IV BOLUS
1000.0000 mL | Freq: Once | INTRAVENOUS | Status: AC
  Administered 2020-11-11: 1000 mL via INTRAVENOUS

## 2020-11-11 MED ORDER — NITROGLYCERIN 1 MG/10 ML FOR IR/CATH LAB
INTRA_ARTERIAL | Status: DC | PRN
  Administered 2020-11-11 (×2): 200 ug via INTRACORONARY

## 2020-11-11 MED ORDER — LIDOCAINE HCL (PF) 1 % IJ SOLN
INTRAMUSCULAR | Status: DC | PRN
  Administered 2020-11-11: 2 mL

## 2020-11-11 MED ORDER — SODIUM CHLORIDE 0.9% FLUSH: 3.0000 mL | INTRAVENOUS | Status: DC | PRN

## 2020-11-11 SURGICAL SUPPLY — 21 items
BALLN SAPPHIRE 1.5X12 (BALLOONS) ×2
BALLN SAPPHIRE 2.0X12 (BALLOONS) ×2
BALLN SAPPHIRE ~~LOC~~ 2.25X15 (BALLOONS) ×1 IMPLANT
BALLOON SAPPHIRE 1.5X12 (BALLOONS) IMPLANT
BALLOON SAPPHIRE 2.0X12 (BALLOONS) IMPLANT
CATH 5FR JL3.5 JR4 ANG PIG MP (CATHETERS) ×1 IMPLANT
CATH LAUNCHER 6FR EBU3.5 (CATHETERS) ×2 IMPLANT
CATH VISTA GUIDE 6FR XB3.5 (CATHETERS) ×1 IMPLANT
DEVICE RAD TR BAND REGULAR (VASCULAR PRODUCTS) ×1 IMPLANT
GLIDESHEATH SLEND SS 6F .021 (SHEATH) ×1 IMPLANT
GUIDEWIRE INQWIRE 1.5J.035X260 (WIRE) IMPLANT
INQWIRE 1.5J .035X260CM (WIRE) ×2
KIT ENCORE 26 ADVANTAGE (KITS) ×1 IMPLANT
KIT HEART LEFT (KITS) ×2 IMPLANT
PACK CARDIAC CATHETERIZATION (CUSTOM PROCEDURE TRAY) ×2 IMPLANT
STENT ONYX FRONTIER 2.0X22 (Permanent Stent) ×1 IMPLANT
TRANSDUCER W/STOPCOCK (MISCELLANEOUS) ×2 IMPLANT
TUBING CIL FLEX 10 FLL-RA (TUBING) ×2 IMPLANT
WIRE ASAHI FIELDER XT 190CM (WIRE) ×1 IMPLANT
WIRE COUGAR XT STRL 190CM (WIRE) ×1 IMPLANT
WIRE RUNTHROUGH .014X180CM (WIRE) ×1 IMPLANT

## 2020-11-11 NOTE — ED Notes (Signed)
Pt verbalized MSE. Signing pad in room not working.

## 2020-11-11 NOTE — ED Notes (Signed)
Spouse updated on transfer.

## 2020-11-11 NOTE — ED Triage Notes (Signed)
Pt complaining of chest pain x3 months. Pt states that pain has progressively gotten worse. Pt describes pain as pressure and "punching". Pt also complains of worsening SOB.

## 2020-11-11 NOTE — Interval H&P Note (Signed)
History and Physical Interval Note:  11/11/2020 4:44 PM  Devin Jackson  has presented today for surgery, with the diagnosis of unstable angina.  The various methods of treatment have been discussed with the patient and family. After consideration of risks, benefits and other options for treatment, the patient has consented to  Procedure(s): LEFT HEART CATH AND CORONARY ANGIOGRAPHY (N/A) as a surgical intervention.  The patient's history has been reviewed, patient examined, no change in status, stable for surgery.  I have reviewed the patient's chart and labs.  Questions were answered to the patient's satisfaction.    Cath Lab Visit (complete for each Cath Lab visit)  Clinical Evaluation Leading to the Procedure:   ACS: Yes.    Non-ACS:  N/A  [name]

## 2020-11-11 NOTE — ED Provider Notes (Signed)
[facility] EMERGENCY DEPARTMENT Provider Note   CSN: [id number] Arrival date & time: 11/11/20  1053     History Chief Complaint  Patient presents with   Chest Pain    Devin Jackson is a 60 y.o. male.  HPI 60 year old male presents with chest pain.  It has been ongoing for about 3 months.  He has been noticing it whenever he does activities and it has become progressively more severe and with less activity over these 3 months.  During the same time he is also been having arm pain that does get worse with exertion but also is there at rest.  He is currently having some of this arm pain now.  The chest pain at times will become severe and over this past weekend it was bad enough that he had to stop walking up a hill multiple times.  He just describes it as a "deep pain".  He also gets short of breath but no obvious sweating.  He gets lightheaded when this occurs.  No known cardiac disease.  Right now there is no chest pain.  Past Medical History:  Diagnosis Date   Hypertension     Patient Active Problem List   Diagnosis Date Noted   NSTEMI (non-ST elevated myocardial infarction) (HCC) 11/11/2020   Acute respiratory disease due to COVID-19 virus 11/18/2019   Thrombocytopenia (HCC) 11/18/2019   Polycythemia 11/18/2019    Past Surgical History:  Procedure Laterality Date   NECK SURGERY Left 2001       Family History  Problem Relation Age of Onset   Heart attack Mother 57   Arrhythmia Sister    Heart attack Brother 14    Social History   Tobacco Use   Smoking status: Never   Smokeless tobacco: Never  Vaping Use   Vaping Use: Never used  Substance Use Topics   Alcohol use: Yes    Alcohol/week: 10.0 standard drinks    Types: 10 Cans of beer per week    Comment: Over 1 week   Drug use: Never    Home Medications Prior to Admission medications   Medication Sig Start Date End Date Taking? Authorizing Provider  famotidine (PEPCID) 20 MG tablet Take 20 mg by mouth  daily.   Yes [provider]  albuterol (VENTOLIN HFA) 108 (90 Base) MCG/ACT inhaler Inhale 2 puffs into the lungs every 6 (six) hours as needed for wheezing or shortness of breath. Patient not taking: No sig reported 11/20/19   Erick Blinks, MD  guaiFENesin-dextromethorphan (ROBITUSSIN DM) 100-10 MG/5ML syrup Take 10 mLs by mouth every 4 (four) hours as needed for cough. Patient not taking: No sig reported 11/20/19   Erick Blinks, MD  pantoprazole (PROTONIX) 40 MG tablet Take 1 tablet (40 mg total) by mouth daily. Patient not taking: No sig reported 11/21/19   Erick Blinks, MD  predniSONE (DELTASONE) 50 MG tablet Take 1 tablet (50 mg total) by mouth daily. Patient not taking: No sig reported 11/20/19   Erick Blinks, MD    Allergies    Morphine and related  Review of Systems   Review of Systems  Constitutional:  Negative for diaphoresis and fever.  Respiratory:  Positive for shortness of breath.   Cardiovascular:  Positive for chest pain. Negative for leg swelling.  Gastrointestinal:  Negative for abdominal pain.  All other systems reviewed and are negative.  Physical Exam Updated Vital Signs BP 135/83   Pulse (!) 53   Temp 98 F (36.7 C) (Oral)  Resp 17   Ht 5\' 9"  (1.753 m)   Wt 77.1 kg   SpO2 97%   BMI 25.10 kg/m   Physical Exam Vitals and nursing note reviewed.  Constitutional:      General: He is not in acute distress.    Appearance: He is well-developed. He is not ill-appearing or diaphoretic.  HENT:     Head: Normocephalic and atraumatic.     Right Ear: External ear normal.     Left Ear: External ear normal.     Nose: Nose normal.  Eyes:     General:        Right eye: No discharge.        Left eye: No discharge.  Cardiovascular:     Rate and Rhythm: Normal rate and regular rhythm.     Heart sounds: Normal heart sounds.  Pulmonary:     Effort: Pulmonary effort is normal.     Breath sounds: Normal breath sounds.  Abdominal:     Palpations:  Abdomen is soft.     Tenderness: There is no abdominal tenderness.  Musculoskeletal:     Cervical back: Neck supple.     Right lower leg: No edema.     Left lower leg: No edema.  Skin:    General: Skin is warm and dry.  Neurological:     Mental Status: He is alert.  Psychiatric:        Mood and Affect: Mood is not anxious.    ED Results / Procedures / Treatments   Labs (all labs ordered are listed, but only abnormal results are displayed) Labs Reviewed  BASIC METABOLIC PANEL - Abnormal; Notable for the following components:      Result Value   Sodium 131 (*)    Glucose, Bld 251 (*)    Creatinine, Ser 1.32 (*)    All other components within normal limits  TROPONIN I (HIGH SENSITIVITY) - Abnormal; Notable for the following components:   Troponin I (High Sensitivity) 92 (*)    All other components within normal limits  TROPONIN I (HIGH SENSITIVITY) - Abnormal; Notable for the following components:   Troponin I (High Sensitivity) 86 (*)    All other components within normal limits  RESP PANEL BY RT-PCR (FLU A&B, COVID) ARPGX2  CBC WITH DIFFERENTIAL/PLATELET  HEPARIN LEVEL (UNFRACTIONATED)    EKG EKG Interpretation  Date/Time:  Monday November 11 2020 12:01:00 EDT Ventricular Rate:  76 PR Interval:  166 QRS Duration: 84 QT Interval:  385 QTC Calculation: 433 R Axis:   -21 Text Interpretation: Sinus rhythm Borderline left axis deviation no significant change since earlier in the day Confirmed by 11-08-1990 [phone]) on 11/11/2020 1:19:40 PM  Radiology DG Chest 2 View  Result Date: 11/11/2020 CLINICAL DATA:  Chest pain and dizziness.  Weakness. EXAM: CHEST - 2 VIEW COMPARISON:  11/18/2019 FINDINGS: Stable cardiomediastinal contours. No pleural effusion or edema. No airspace densities identified. Scarring identified within the left midlung and left base. No superimposed airspace scratch set the visualized osseous structures are unremarkable. No acute abnormality.  IMPRESSION: No acute cardiopulmonary abnormalities. Electronically Signed   By: 01/18/2020 M.D.   On: 11/11/2020 13:16    Procedures Procedures   Medications Ordered in ED Medications  nitroGLYCERIN (NITROSTAT) SL tablet 0.4 mg (0.4 mg Sublingual Not Given 11/11/20 1307)  heparin ADULT infusion 100 units/mL (25000 units/253mL) (900 Units/hr Intravenous New Bag/Given 11/11/20 1328)  atorvastatin (LIPITOR) tablet 80 mg (80 mg Oral Patient Refused/Not Given 11/11/20 1545)  aspirin EC tablet 81 mg (has no administration in time range)  nitroGLYCERIN (NITROSTAT) SL tablet 0.4 mg (has no administration in time range)  acetaminophen (TYLENOL) tablet 650 mg (has no administration in time range)  ondansetron (ZOFRAN) injection 4 mg (has no administration in time range)  amLODipine (NORVASC) tablet 5 mg (5 mg Oral Patient Refused/Not Given 11/11/20 1545)  sodium chloride 0.9 % bolus 1,000 mL (0 mLs Intravenous Stopped 11/11/20 1345)  aspirin chewable tablet 324 mg (324 mg Oral Given 11/11/20 1212)  heparin bolus via infusion 4,000 Units (4,000 Units Intravenous Bolus from Bag 11/11/20 1328)    ED Course  I have reviewed the triage vital signs and the nursing notes.  Pertinent labs & imaging results that were available during my care of the patient were reviewed by me and considered in my medical decision making (see chart for details).    MDM Rules/Calculators/A&P                           Symptoms sound like progressive angina to the point that is now unstable angina and with the positive troponin seems to be a non-STEMI.  He was given heparin.  He was also given aspirin.  His arm pain seems to have gone away with the aspirin and now he has no symptoms at all.  Discussed with Dr. Diona Browner who will admit to [facility]. Final Clinical Impression(s) / ED Diagnoses Final diagnoses:  NSTEMI (non-ST elevated myocardial infarction) [facility])    Rx / DC Orders ED Discharge Orders     None         Pricilla Loveless, MD 11/11/20 1555

## 2020-11-11 NOTE — Progress Notes (Signed)
ANTICOAGULATION CONSULT NOTE - Initial Consult  Pharmacy Consult for heparin Indication: chest pain/ACS  Allergies  Allergen Reactions   Morphine And Related Nausea And Vomiting    Patient Measurements: Height: 5\' 9"  (175.3 cm) Weight: 77.1 kg (170 lb) IBW/kg (Calculated) : 70.7 Heparin Dosing Weight:77 kg  Vital Signs: Temp: 97.9 F (36.6 C) (09/26 1113) Temp Source: Oral (09/26 1113) BP: 136/71 (09/26 1300) Pulse Rate: 57 (09/26 1300)  Labs: Recent Labs    11/11/20 1205  HGB 16.8  HCT 49.7  PLT 201  CREATININE 1.32*  TROPONINIHS 92*    Estimated Creatinine Clearance: 58.8 mL/min (A) (by C-G formula based on SCr of 1.32 mg/dL (H)).   Medical History: Past Medical History:  Diagnosis Date   Hypertension     Medications:  (Not in a hospital admission)   Assessment: Pharmacy consulted to dose heparin in patient with chest pain/ACS.  Patient is not on anticoagulation prior to admission.   Troponin 92   Goal of Therapy:  Heparin level 0.3-0.7 units/ml Monitor platelets by anticoagulation protocol: Yes   Plan:  Give 4000 units bolus x 1 Start heparin infusion at 900 units/hr Check anti-Xa level in 6 hours and daily while on heparin Continue to monitor H&H and platelets  11/13/20, PharmD Clinical Pharmacist 11/11/2020 1:07 PM

## 2020-11-11 NOTE — H&P (Addendum)
Cardiology Admission History and Physical:   Patient ID: Devin Jackson MRN: [id number]; DOB: 1960-12-31   Admission date: 11/11/2020  PCP:  Pcp, No   [facility] Providers Cardiologist:  New to [facility]      Chief Complaint:  Chest pain  Patient Profile:   Devin Jackson is a 60 y.o. male with HTN who is being seen 11/11/2020 for the evaluation of chest pain.  History of Present Illness:   Mr. Devin Jackson with a history of HTN, and  cardiac catheterization in 2004 showed minimal plaque.  Presents with 3 month history of progressive chest pain with activity.  Patient describes it as chest tightness into his neck down his arms associated with dyspnea and diaphoresis.  He had 2 severe episodes 2 weekends ago when mowing lawn and moving decking.  This weekend he had chest tightness with going up a small incline and had to rest at top.  This morning he had some left arm pain without activity and came to the emergency room.  His arm pain is now subsided on IV nitroglycerin.  His mother had several MIs and stents in her late 83s.  Brother with MI in his 26s.  Sister with arrhythmias.  He has never treated his hypertension or HLD.  Has never smoked.  Does not have a primary care doctor.  Checks his glucose on occasion because he gets dizzy and feels better when he eats.   Past Medical History:  Diagnosis Date   Hypertension     Past Surgical History:  Procedure Laterality Date   NECK SURGERY Left 2001     Medications Prior to Admission: Prior to Admission medications   Medication Sig Start Date End Date Taking? Authorizing Provider  albuterol (VENTOLIN HFA) 108 (90 Base) MCG/ACT inhaler Inhale 2 puffs into the lungs every 6 (six) hours as needed for wheezing or shortness of breath. 11/20/19   Erick Blinks, MD  guaiFENesin-dextromethorphan (ROBITUSSIN DM) 100-10 MG/5ML syrup Take 10 mLs by mouth every 4 (four) hours as needed for cough. 11/20/19   Erick Blinks, MD  pantoprazole (PROTONIX) 40 MG  tablet Take 1 tablet (40 mg total) by mouth daily. 11/21/19   Erick Blinks, MD  predniSONE (DELTASONE) 50 MG tablet Take 1 tablet (50 mg total) by mouth daily. 11/20/19   Erick Blinks, MD     Allergies:    Allergies  Allergen Reactions   Morphine And Related Nausea And Vomiting    Social History:   Social History   Tobacco Use   Smoking status: Never   Smokeless tobacco: Never  Substance Use Topics   Alcohol use: Yes    Alcohol/week: 10.0 standard drinks    Types: 10 Cans of beer per week    Comment: Over 1 week     Family History:    The patient's family history includes Arrhythmia in his sister; Heart attack (age of onset: 36) in his brother; Heart attack (age of onset: 88) in his mother.    ROS:  Please see the history of present illness.  Review of Systems  Constitutional: Negative.  HENT: Negative.    Cardiovascular:  Positive for chest pain and dyspnea on exertion.  Respiratory: Negative.    Endocrine: Negative.   Hematologic/Lymphatic: Negative.   Musculoskeletal: Negative.   Gastrointestinal: Negative.   Genitourinary: Negative.   Neurological: Negative.   All other ROS reviewed and negative.     Physical Exam/Data:   Vitals:   11/11/20 1300 11/11/20 1330 11/11/20 1400 11/11/20  1430  BP: 136/71 (!) 143/86 (!) 141/94 (!) 187/88  Pulse: (!) 57 (!) 55 (!) 52 60  Resp: (!) 21 19 17 15   Temp:      TempSrc:      SpO2: 97% 97% 96% 98%  Weight:      Height:        Intake/Output Summary (Last 24 hours) at 11/11/2020 1520 Last data filed at 11/11/2020 1345 Gross per 24 hour  Intake 1000 ml  Output --  Net 1000 ml   Last 3 Weights 11/11/2020 11/18/2019  Weight (lbs) 170 lb 170 lb  Weight (kg) 77.111 kg 77.111 kg     Body mass index is 25.1 kg/m.  General:  Well nourished, well developed, in no acute distress  HEENT: normal Neck: no  JVD Vascular: No carotid bruits; Distal pulses 2+ bilaterally   Cardiac:  normal S1, S2; RRR; no murmur   Lungs:   clear to auscultation bilaterally, no wheezing, rhonchi or rales  Abd: soft, nontender, no hepatomegaly  Ext: no  edema Musculoskeletal:  No deformities, BUE and BLE strength normal and equal Skin: warm and dry  Neuro:  CNs 2-12 intact, no focal abnormalities noted Psych:  Normal affect   EKG:  The ECG that was done today was personally reviewed and demonstrates NSR normal EKG  Laboratory Data:  High Sensitivity Troponin:   Recent Labs  Lab 11/11/20 1205  TROPONINIHS 92*      Chemistry Recent Labs  Lab 11/11/20 1205  NA 131*  K 3.8  CL 99  CO2 23  GLUCOSE 251*  BUN 16  CREATININE 1.32*  CALCIUM 9.4  GFRNONAA >60  ANIONGAP 9     Hematology Recent Labs  Lab 11/11/20 1205  WBC 5.5  RBC 5.69  HGB 16.8  HCT 49.7  MCV 87.3  MCH 29.5  MCHC 33.8  RDW 13.7  PLT 201    Radiology/Studies:  DG Chest 2 View  Result Date: 11/11/2020 CLINICAL DATA:  Chest pain and dizziness.  Weakness. EXAM: CHEST - 2 VIEW COMPARISON:  11/18/2019 FINDINGS: Stable cardiomediastinal contours. No pleural effusion or edema. No airspace densities identified. Scarring identified within the left midlung and left base. No superimposed airspace scratch set the visualized osseous structures are unremarkable. No acute abnormality. IMPRESSION: No acute cardiopulmonary abnormalities. Electronically Signed   By: 01/18/2020 M.D.   On: 11/11/2020 13:16     Assessment and Plan:   Unstable angina/NSTEMI with 3 month history of worsening exertional chest pain,left arm pain this am but now gone on IV heparin, non-acute EKG but first troponin 92.  Discussed cardiac catheterization with patient and wife who are agreeable.  We will transfer to [facility] as soon as we get a bed.Awaiting to see if cath can be done today. I have reviewed the risks, indications, and alternatives to angioplasty and stenting with the patient. Risks include but are not limited to bleeding, infection, vascular injury, stroke, myocardial  infection, arrhythmia, kidney injury, radiation-related injury in the case of prolonged fluoroscopy use, emergency cardiac surgery, and death. The patient understands the risks of serious complication is low (<1%) and patient agrees to proceed.    HTN untreated for many years.  Hold off on  beta-blocker for now with bradycardia and will add amlodipine for now.  HLD untreated out of atorvastatin.  Family history of early CAD  Renal insufficiency-Crt 1.32 gentle fluids.  Elevated glucose 252-no history of DM but checks at home and has been ok. Sometimes gets  lightheaded relieved with eating.  Check A1c   Risk Assessment/Risk Scores:    TIMI Risk Score for Unstable Angina or Non-ST Elevation MI:   The patient's TIMI risk score is 2, which indicates a 8% risk of all cause mortality, new or recurrent myocardial infarction or need for urgent revascularization in the next 14 days.       Severity of Illness: The appropriate patient status for this patient is OBSERVATION. Observation status is judged to be reasonable and necessary in order to provide the required intensity of service to ensure the patient's safety. The patient's presenting symptoms, physical exam findings, and initial radiographic and laboratory data in the context of their medical condition is felt to place them at decreased risk for further clinical deterioration. Furthermore, it is anticipated that the patient will be medically stable for discharge from the hospital within 2 midnights of admission. The following factors support the patient status of observation.   " The patient's presenting symptoms include unstable angina. " The physical exam findings include normal. " The initial radiographic and laboratory data are elevated troponins, glucose and creatinine.   For questions or updates, please contact [facility] Please consult [email or web address] for contact info under     Signed, Herma Carson PA-C 11/11/2020 3:20 PM       Attending note:  Patient seen and examined.  Case discussed with Ms. Geni Bers PA-C, I agree with her above findings.  Mr. Dorsch presents with progressive exertional chest discomfort with intermittent left arm discomfort over the last several weeks.  Episodes have been most notable with yard work and also walking up inclines.  He came to the ER today with left arm discomfort at rest.  He does not have a PCP at this time, looks to have untreated hypertension, also type 2 diabetes mellitus based on random glucose of 251, lipid status is uncertain.  He does have a family history of premature CAD.  On examination he appears comfortable, afebrile, systolic blood pressure 130s to 180s, heart rate in the 50s in sinus rhythm by telemetry which I personally reviewed.  Lungs are clear.  Cardiac exam reveals RRR without gallop.  He has no peripheral edema  Pertinent lab work includes potassium 3.8, BUN 16, creatinine 1.32, high-sensitivity troponin I 92, hemoglobin 16.8, platelets 201, glucose 251, SARS coronavirus 2 test negative.  Chest x-ray reports no acute process.  I personally reviewed his ECG which shows normal sinus rhythm.  Unstable angina symptoms with enzymatic evidence of NSTEMI, no acute ST segment changes by ECG.  He has had worsening exertional symptoms over the last several weeks culminating in rest left arm discomfort today.  Plan is for transfer to [facility] for a diagnostic cardiac catheterization with eye toward revascularization options.  Risks and benefits discussed and he is in agreement to proceed.  Jonelle Sidle, M.D., F.A.C.C.

## 2020-11-11 NOTE — ED Notes (Signed)
Patient transported to X-ray 

## 2020-11-12 ENCOUNTER — Other Ambulatory Visit (HOSPITAL_COMMUNITY): Payer: Self-pay

## 2020-11-12 ENCOUNTER — Encounter (HOSPITAL_COMMUNITY): Payer: Self-pay | Admitting: Internal Medicine

## 2020-11-12 ENCOUNTER — Observation Stay (HOSPITAL_BASED_OUTPATIENT_CLINIC_OR_DEPARTMENT_OTHER): Payer: Medicaid Other

## 2020-11-12 DIAGNOSIS — Z9861 Coronary angioplasty status: Secondary | ICD-10-CM

## 2020-11-12 DIAGNOSIS — I25110 Atherosclerotic heart disease of native coronary artery with unstable angina pectoris: Secondary | ICD-10-CM | POA: Diagnosis not present

## 2020-11-12 DIAGNOSIS — Z20822 Contact with and (suspected) exposure to covid-19: Secondary | ICD-10-CM | POA: Diagnosis not present

## 2020-11-12 DIAGNOSIS — I214 Non-ST elevation (NSTEMI) myocardial infarction: Secondary | ICD-10-CM

## 2020-11-12 DIAGNOSIS — I10 Essential (primary) hypertension: Secondary | ICD-10-CM | POA: Diagnosis not present

## 2020-11-12 LAB — CBC
HCT: 45.8 % (ref 39.0–52.0)
Hemoglobin: 16 g/dL (ref 13.0–17.0)
MCH: 29.7 pg (ref 26.0–34.0)
MCHC: 34.9 g/dL (ref 30.0–36.0)
MCV: 85.1 fL (ref 80.0–100.0)
Platelets: 177 10*3/uL (ref 150–400)
RBC: 5.38 MIL/uL (ref 4.22–5.81)
RDW: 13.5 % (ref 11.5–15.5)
WBC: 9 10*3/uL (ref 4.0–10.5)
nRBC: 0 % (ref 0.0–0.2)

## 2020-11-12 LAB — BASIC METABOLIC PANEL
Anion gap: 6 (ref 5–15)
BUN: 14 mg/dL (ref 8–23)
CO2: 24 mmol/L (ref 22–32)
Calcium: 9.1 mg/dL (ref 8.9–10.3)
Chloride: 104 mmol/L (ref 98–111)
Creatinine, Ser: 1.35 mg/dL — ABNORMAL HIGH (ref 0.61–1.24)
GFR, Estimated: 60 mL/min — ABNORMAL LOW (ref >60)
Glucose, Bld: 120 mg/dL — ABNORMAL HIGH (ref 70–99)
Potassium: 4.2 mmol/L (ref 3.5–5.1)
Sodium: 134 mmol/L — ABNORMAL LOW (ref 135–145)

## 2020-11-12 LAB — LDL CHOLESTEROL, DIRECT: Direct LDL: 147.5 mg/dL — ABNORMAL HIGH (ref 0–99)

## 2020-11-12 LAB — ECHOCARDIOGRAM COMPLETE
Area-P 1/2: 2.62 cm2
Height: 69 in
S' Lateral: 3.5 cm
Weight: 2739.2 oz

## 2020-11-12 LAB — LIPID PANEL
Cholesterol: 238 mg/dL — ABNORMAL HIGH (ref 0–200)
HDL: 33 mg/dL — ABNORMAL LOW (ref >40)
LDL Cholesterol: UNDETERMINED mg/dL (ref 0–99)
Total CHOL/HDL Ratio: 7.2 RATIO
Triglycerides: 456 mg/dL — ABNORMAL HIGH (ref <150)
VLDL: UNDETERMINED mg/dL (ref 0–40)

## 2020-11-12 LAB — HEMOGLOBIN A1C
Hgb A1c MFr Bld: 6.6 % — ABNORMAL HIGH (ref 4.8–5.6)
Mean Plasma Glucose: 143 mg/dL

## 2020-11-12 MED ORDER — TICAGRELOR 90 MG PO TABS
90.0000 mg | ORAL_TABLET | Freq: Two times a day (BID) | ORAL | 11 refills | Status: DC
  Filled 2020-11-12: qty 60, 30d supply, fill #0

## 2020-11-12 MED ORDER — ATORVASTATIN CALCIUM 80 MG PO TABS
80.0000 mg | ORAL_TABLET | Freq: Every day | ORAL | 11 refills | Status: AC
  Filled 2020-11-12: qty 30, 30d supply, fill #0

## 2020-11-12 MED ORDER — AMLODIPINE BESYLATE 5 MG PO TABS
5.0000 mg | ORAL_TABLET | Freq: Every day | ORAL | 11 refills | Status: AC
  Filled 2020-11-12: qty 30, 30d supply, fill #0

## 2020-11-12 MED ORDER — NITROGLYCERIN 0.4 MG SL SUBL
0.4000 mg | SUBLINGUAL_TABLET | SUBLINGUAL | 12 refills | Status: AC | PRN
  Filled 2020-11-12: qty 25, 1d supply, fill #0

## 2020-11-12 MED ORDER — ASPIRIN 81 MG PO TBEC
81.0000 mg | DELAYED_RELEASE_TABLET | Freq: Every day | ORAL | 11 refills | Status: AC
  Filled 2020-11-12: qty 30, 30d supply, fill #0

## 2020-11-12 NOTE — Progress Notes (Signed)
Echocardiogram 2D Echocardiogram has been performed.  Warren Lacy Tavis Kring RDCS 11/12/2020, 12:59 PM

## 2020-11-12 NOTE — Progress Notes (Signed)
CARDIAC REHAB PHASE I   PRE:  Rate/Rhythm: 51 SB asleep    BP: sitting 150/90    SaO2: 97 RA  MODE:  Ambulation: 400 ft   POST:  Rate/Rhythm: 71 SR    BP: sitting 151/110, 146/97 after 10 min     SaO2: 97 RA  Pt thankful to be able to walk without SOB. Felt well. BP elevated, worse with activity.  Long discussion of MI, stent, Brilinta importance, restrictions, importance of meds for BP and chol, diet, exercise, NTG and CRPII. Pt receptive. He does not like taking meds and hopes to not need all of them long term. Willing to exercise and work on his diet. Discussed decreasing high carb drinks. Awaiting A1C. Will place referral for CRPII [facility] however pt not interested.  [id number]   Harriet Masson CES, ACSM 11/12/2020 8:50 AM

## 2020-11-12 NOTE — Plan of Care (Signed)

## 2020-11-12 NOTE — Care Management (Signed)
11-12-20 1005 Case Manager spoke with the patient regarding no insurance and no primary care provider (PCP). Patient states he and his wife live in a home that the mortgage is paid. Patient and wife, neither work at this time. Patient has applied for Medicaid. Case Manager discussed being seen at the [facility] Department-patient is agreeable and a hospital follow up appointment was scheduled. Patient states he has transportation to the appointment. Patient will be seen post visit with Meriam Sprague for additional medication assistance. Case Manager asked if medications can be sent to [facility] Manager will follow for [facility] assistance.

## 2020-11-12 NOTE — Discharge Summary (Signed)
Discharge Summary    Patient ID: Devin Jackson MRN: [id number]; DOB: 10/11/1960  Admit date: 11/11/2020 Discharge date: 11/12/2020  PCP:  Pcp, No   [facility] Providers Cardiologist:  Nona Dell, MD        Discharge Diagnoses    Active Problems:   NSTEMI (non-ST elevated myocardial infarction) [facility])    Diagnostic Studies/Procedures    CARDIAC CATH: 11/11/2020 Conclusions: Severe single vessel coronary artery disease with 100% occluded OM2 (likely subacute) with very faint right-to-left and left-to-left collaterals.  Minimal luminal irregularities were also noted in the RCA. Mildly elevated left ventricular filling pressure (LVEDP 15-20 mmHg). Challenging but successful PCI to OM2 using Onyx Frontier 2.0 x 22 mm drug-eluting stent with 0% residual stenosis and TIMI-3 flow.   Recommendations: Dual antiplatelet therapy with aspirin and ticagrelor for at least 12 months. Aggressive secondary prevention. Intervention   _____________   History of Present Illness     Devin Jackson is a 60 y.o. male with untreated hypertension and hyperlipidemia, FH CAD who went to [facility] on 9/26 with chest pain.  Hospital Course     Consultants: None  He was seen in the emergency room by Dr. Diona Browner.  His troponins were only mildly elevated, but his symptoms were consistent with NSTEMI.  Cardiac catheterization was indicated, he was transferred to [facility] and taken to the Cath Lab.  Cardiac catheterization results are above.  He had a stent to the OM, essentially single-vessel disease.  He tolerated the procedure well.  He had not been on any medications for blood pressure cholesterol prior to admission.  A lipid profile is below, he was started on high-dose statin.    He was started on amlodipine 5 mg daily for his blood pressure.  He had resting bradycardia so beta-blocker was not used.  He may need increased medication for better blood pressure control, but that this  time, his SBP is in the 120s at times so med titration can be done as an outpatient.  On 9/27, he was seen by Dr. Cristal Deer and all data were reviewed.  On telemetry, he had some brief junctional rhythms and a 3 beat run of VT as well as sinus bradycardia.  The slowest heart rates occurred overnight.  He may need to be screened for sleep apnea as an outpatient.  An echocardiogram was performed, results are pending.  He was seen by cardiac rehab and exercise guidelines as well as heart-healthy lifestyle modifications were discussed.  A hemoglobin A1c was ordered, but we do not have results.  However, he is blood sugar was greater than 200 at times.  Was consulted because he does not have insurance and will need help with his medications going forward.  He also does not have a PCP.  An appointment with the [facility] department was arranged for follow-up.   Did the patient have an acute coronary syndrome (MI, NSTEMI, STEMI, etc) this admission?:  Yes                               AHA/ACC Clinical Performance & Quality Measures: Aspirin prescribed? - Yes ADP Receptor Inhibitor (Plavix/Clopidogrel, Brilinta/Ticagrelor or Effient/Prasugrel) prescribed (includes medically managed patients)? - Yes Beta Blocker prescribed? - No - bradycardia High Intensity Statin (Lipitor 40-80mg  or Crestor 20-40mg ) prescribed? - Yes EF assessed during THIS hospitalization? - Yes For EF <40%, was ACEI/ARB prescribed? - Not Applicable (EF >/= 40%) For  EF <40%, Aldosterone Antagonist (Spironolactone or Eplerenone) prescribed? - Not Applicable (EF >/= 40%) Cardiac Rehab Phase II ordered (including medically managed patients)? - Yes      _____________  Discharge Vitals Blood pressure (!) 146/97, pulse 66, temperature 98.1 F (36.7 C), temperature source Oral, resp. rate 20, height 5\' 9"  (1.753 m), weight 77.7 kg, SpO2 97 %.  Filed Weights   11/11/20 1114 11/12/20 0500  Weight: 77.1 kg 77.7 kg     Labs & Radiologic Studies    CBC Recent Labs    11/11/20 1205 11/12/20 0423  WBC 5.5 9.0  NEUTROABS 3.7  --   HGB 16.8 16.0  HCT 49.7 45.8  MCV 87.3 85.1  PLT 201 177   Basic Metabolic Panel Recent Labs    11/14/20 1205 11/12/20 0423  NA 131* 134*  K 3.8 4.2  CL 99 104  CO2 23 24  GLUCOSE 251* 120*  BUN 16 14  CREATININE 1.32* 1.35*  CALCIUM 9.4 9.1   Liver Function Tests No results for input(s): AST, ALT, ALKPHOS, BILITOT, PROT, ALBUMIN in the last 72 hours. No results for input(s): LIPASE, AMYLASE in the last 72 hours. High Sensitivity Troponin:   Recent Labs  Lab 11/11/20 1205 11/11/20 1430  TROPONINIHS 92* 86*    BNP Invalid input(s): POCBNP D-Dimer No results for input(s): DDIMER in the last 72 hours. Hemoglobin A1C No results for input(s): HGBA1C in the last 72 hours. Fasting Lipid Panel Recent Labs    11/12/20 0423  CHOL 238*  HDL 33*  LDLCALC UNABLE TO CALCULATE IF TRIGLYCERIDE OVER 400 mg/dL  TRIG 11/14/20*  CHOLHDL 7.2  LDLDIRECT 147.5*   Thyroid Function Tests No results for input(s): TSH, T4TOTAL, T3FREE, THYROIDAB in the last 72 hours.  Invalid input(s): FREET3 _____________  DG Chest 2 View  Result Date: 11/11/2020 CLINICAL DATA:  Chest pain and dizziness.  Weakness. EXAM: CHEST - 2 VIEW COMPARISON:  11/18/2019 FINDINGS: Stable cardiomediastinal contours. No pleural effusion or edema. No airspace densities identified. Scarring identified within the left midlung and left base. No superimposed airspace scratch set the visualized osseous structures are unremarkable. No acute abnormality. IMPRESSION: No acute cardiopulmonary abnormalities. Electronically Signed   By: 01/18/2020 M.D.   On: 11/11/2020 13:16   CARDIAC CATHETERIZATION  Result Date: 11/11/2020 Conclusions: Severe single vessel coronary artery disease with 100% occluded OM2 (likely subacute) with very faint right-to-left and left-to-left collaterals.  Minimal luminal  irregularities were also noted in the RCA. Mildly elevated left ventricular filling pressure (LVEDP 15-20 mmHg). Challenging but successful PCI to OM2 using Onyx Frontier 2.0 x 22 mm drug-eluting stent with 0% residual stenosis and TIMI-3 flow. Recommendations: Dual antiplatelet therapy with aspirin and ticagrelor for at least 12 months. Aggressive secondary prevention. 11/13/2020, MD [facility]  Disposition   Pt is being discharged home today in good condition.  Follow-up Plans & Appointments     Follow-up Information     [facility], [facility] Follow up on 11/18/2020.   Why: @ 1:00 pm with [facility] Follow up Appointment-please bring ID, proof of income if working,d/c summary, wear a mask to the appopintment. You will see [facility] after the visit for medication assistance. Contact information: [address] [place] [phone]                Discharge Instructions     Amb Referral to Cardiac Rehabilitation   Complete by: As directed    Diagnosis:  Coronary  Stents NSTEMI PTCA     After initial evaluation and assessments completed: Virtual Based Care may be provided alone or in conjunction with Phase 2 Cardiac Rehab based on patient barriers.: Yes       Discharge Medications   Allergies as of 11/12/2020       Reactions   Morphine And Related Nausea And Vomiting        Medication List     STOP taking these medications    pantoprazole 40 MG tablet Commonly known as: PROTONIX   predniSONE 50 MG tablet Commonly known as: DELTASONE       TAKE these medications    albuterol 108 (90 Base) MCG/ACT inhaler Commonly known as: VENTOLIN HFA Inhale 2 puffs into the lungs every 6 (six) hours as needed for wheezing or shortness of breath.   amLODipine 5 MG tablet Commonly known as: NORVASC Take 1 tablet (5 mg total) by mouth daily. Start taking on: November 13, 2020   aspirin 81 MG EC tablet Take 1 tablet (81  mg total) by mouth daily. Swallow whole. Start taking on: November 13, 2020   atorvastatin 80 MG tablet Commonly known as: LIPITOR Take 1 tablet (80 mg total) by mouth daily. Start taking on: November 13, 2020   famotidine 20 MG tablet Commonly known as: PEPCID Take 20 mg by mouth daily.   guaiFENesin-dextromethorphan 100-10 MG/5ML syrup Commonly known as: ROBITUSSIN DM Take 10 mLs by mouth every 4 (four) hours as needed for cough.   nitroGLYCERIN 0.4 MG SL tablet Commonly known as: NITROSTAT Place 1 tablet (0.4 mg total) under the tongue every 5 (five) minutes x 3 doses as needed for chest pain.   ticagrelor 90 MG Tabs tablet Commonly known as: BRILINTA Take 1 tablet (90 mg total) by mouth 2 (two) times daily.           Outstanding Labs/Studies   Echo results, A1c  Duration of Discharge Encounter   Greater than 30 minutes including physician time.  Signed, Theodore Demark, PA-C 11/12/2020, 10:45 AM

## 2020-11-12 NOTE — Discharge Instructions (Signed)
PLEASE REMEMBER TO BRING ALL OF YOUR MEDICATIONS TO EACH OF YOUR FOLLOW-UP OFFICE VISITS. ° °PLEASE ATTEND ALL SCHEDULED FOLLOW-UP APPOINTMENTS.  ° °Activity: Increase activity slowly as tolerated. You may shower, but no soaking baths (or swimming) for 1 week. No driving for 1 week. No lifting over 5 lbs for 2 weeks. No sexual activity for 1 week.  ° °You May Return to Work: in 3 weeks (if applicable) ° °Wound Care: You may wash cath site gently with soap and water. Keep cath site clean and dry. If you notice pain, swelling, bleeding or pus at your cath site, please call [phone]. ° ° ° °Cardiac Cath Site Care °Refer to this sheet in the next few weeks. These instructions provide you with information on caring for yourself after your procedure. Your caregiver may also give you more specific instructions. Your treatment has been planned according to current medical practices, but problems sometimes occur. Call your caregiver if you have any problems or questions after your procedure. °HOME CARE INSTRUCTIONS °· You may shower 24 hours after the procedure. Remove the bandage (dressing) and gently wash the site with plain soap and water. Gently pat the site dry.  °· Do not apply powder or lotion to the site.  °· Do not sit in a bathtub, swimming pool, or whirlpool for 5 to 7 days.  °· No bending, squatting, or lifting anything over 10 pounds (4.5 kg) as directed by your caregiver.  °· Inspect the site at least twice daily.  °· Do not drive home if you are discharged the same day of the procedure. Have someone else drive you.  °· You may drive 24 hours after the procedure unless otherwise instructed by your caregiver.  °What to expect: °· Any bruising will usually fade within 1 to 2 weeks.  °· Blood that collects in the tissue (hematoma) may be painful to the touch. It should usually decrease in size and tenderness within 1 to 2 weeks.  °SEEK IMMEDIATE MEDICAL CARE IF: °· You have unusual pain at the site or down the  affected limb.  °· You have redness, warmth, swelling, or pain at the site.  °· You have drainage (other than a small amount of blood on the dressing).  °· You have chills.  °· You have a fever or persistent symptoms for more than 72 hours.  °· You have a fever and your symptoms suddenly get worse.  °· Your leg becomes pale, cool, tingly, or numb.  °· You have heavy bleeding from the site. Hold pressure on the site.  °Document Released: 03/07/2010 Document Revised: 01/22/2011 Document Reviewed:  ° °

## 2020-11-13 ENCOUNTER — Telehealth: Payer: Self-pay | Admitting: *Deleted

## 2020-11-13 NOTE — Telephone Encounter (Signed)
Patient contacted regarding discharge from [facility] on 11/12/20.  Patient understands to follow up with provider Randall An, PA-C on 12/04/20 at 2:00 pm at [facility]. Patient understands discharge instructions? Yes  Patient understands medications and regiment? Yes  Patient understands to bring all medications to this visit? Yes   Ask patient:  Are you enrolled in My Chart (yes or no)  If no ask patient if they would like to enroll.    Postop Surgical Patients:                What is your wound status? Any signs/ symptoms of infection (Temp, redness/ red streaks, swelling, purulent drainage, foul odor or smell)?              Please do not place any creams/ lotions/ or antibiotic ointment on any surgical incisions/ wounds without physician approval.              Do you have any questions about your medications?  All medications (except pain medications) are to be filled by your Cardiologist AFTER your first post op       appointment with them.  Are you taking your pain medication?              How is your pain controlled? Pain level?              If you require a refill on pain medications, know that the same medication/ amount may not be prescribed or a refill may not be given.  Please contact your pharmacy for refill requests.               Do you have help at home with ADL's?  If you have home health, have you been contacted or seen by the agency?              Please refer to your Pre/post surgery booklet, there is a lot of useful information in it that may answer any questions you may have.              Please note that it is ok to remove your surgical dressing, shower (soap/ water), and pat the incision dry.  For surgery related questions staff will route a phone note to CV DIV TCTS [facility] pool  [facility] [address] [place], [place] [address]  For nonsurgery patients please delete the surgery questions.

## 2020-11-13 NOTE — Telephone Encounter (Signed)
Called to do [facility] phone call. No answer. Left msg to call back.

## 2020-11-21 ENCOUNTER — Ambulatory Visit: Payer: Self-pay | Admitting: Family Medicine

## 2020-11-26 DIAGNOSIS — Z006 Encounter for examination for normal comparison and control in clinical research program: Secondary | ICD-10-CM

## 2020-11-26 NOTE — Research (Signed)
Called patient to discuss [employer] Lpa trial with no answer. Message was left asking for a return call

## 2020-11-27 ENCOUNTER — Telehealth (HOSPITAL_COMMUNITY): Payer: Self-pay | Admitting: Pharmacist

## 2020-11-27 ENCOUNTER — Other Ambulatory Visit (HOSPITAL_COMMUNITY): Payer: Self-pay

## 2020-11-27 NOTE — Telephone Encounter (Signed)
Transitions of Care Pharmacy  ° °Call attempted for a pharmacy transitions of care follow-up. HIPAA appropriate voicemail was left with call back information provided.  ° °Call attempt #1. Will follow-up in 2-3 days.  °  °

## 2020-11-28 ENCOUNTER — Telehealth (HOSPITAL_COMMUNITY): Payer: Self-pay

## 2020-11-28 NOTE — Telephone Encounter (Signed)
Transitions of Care Pharmacy   Call attempted for a pharmacy transitions of care follow-up. HIPAA appropriate voicemail was left with call back information provided.   Call attempt #2. Will follow-up in 2-3 days.    

## 2020-11-29 ENCOUNTER — Other Ambulatory Visit (HOSPITAL_COMMUNITY): Payer: Self-pay

## 2020-11-29 ENCOUNTER — Telehealth (HOSPITAL_COMMUNITY): Payer: Self-pay

## 2020-11-29 NOTE — Telephone Encounter (Signed)
Pharmacy Transitions of Care Follow-up Telephone Call  Date of discharge: 11/12/20  Discharge Diagnosis: NSTEMI w/DES  How have you been since you were released from the hospital?  Patient doing well since discharge. Patient has concerns about Brilinta pricing without insurance. Encouraged patient to call cardiology office for possible programs with manufacturer. Has follow up appointment next Monday.  Medication changes made at discharge:  - START:  amLODipine (NORVASC)  Aspirin Low Dose (aspirin)  atorvastatin (LIPITOR)  Brilinta (ticagrelor)  nitroGLYCERIN (NITROSTAT)   - STOPPED:  pantoprazole 40 MG tablet (PROTONIX)  predniSONE 50 MG tablet (DELTASONE)   - CHANGED: n/a  Medication changes verified by the patient? Yes    Medication Accessibility:  Home Pharmacy: Patient uses [facility]   Was the patient provided with refills on discharged medications?  yes  Have all prescriptions been transferred from [facility] to home pharmacy?  Patient already has refills at home pharmacy and did not need transfers at this time.  Is the patient able to afford medications? No ins, covered by [facility] for initial fills Notable copays: Brilinta Eligible patient assistance: MAP possible    Medication Review:  TICAGRELOR (BRILINTA) Ticagrelor 90 mg BID initiated on 11/12/20.  - Educated patient on expected duration of therapy of aspirin with ticagrelor of at least 1 year.  - Discussed importance of taking medication around the same time every day, - Reviewed potential DDIs with patient - Advised patient of medications to avoid (NSAIDs, aspirin maintenance doses>100 mg daily) - Educated that Tylenol (acetaminophen) will be the preferred analgesic to prevent risk of bleeding  - Emphasized importance of monitoring for signs and symptoms of bleeding (abnormal bruising, prolonged bleeding, nose bleeds, bleeding from gums, discolored urine, black tarry stools)  - Educated  patient to notify doctor if shortness of breath or abnormal heartbeat occur - Advised patient to alert all providers of antiplatelet therapy prior to starting a new medication or having a procedure    Follow-up Appointments:  PCP Hospital f/u appt confirmed? None scheduled at this time   [facility] f/u appt confirmed?   Scheduled to see Randall An, PA, cardiology on 10/19 @ 2 pm.   If their condition worsens, is the pt aware to call PCP or go to the Emergency Dept.? yes  Final Patient Assessment:  Patient has follow up scheduled and refills at home pharmacy

## 2020-12-04 ENCOUNTER — Other Ambulatory Visit: Payer: Self-pay

## 2020-12-04 ENCOUNTER — Encounter: Payer: Self-pay | Admitting: Student

## 2020-12-04 ENCOUNTER — Ambulatory Visit (INDEPENDENT_AMBULATORY_CARE_PROVIDER_SITE_OTHER): Payer: Medicaid Other | Admitting: Student

## 2020-12-04 VITALS — BP 118/74 | HR 70 | Ht 69.0 in | Wt 171.2 lb

## 2020-12-04 DIAGNOSIS — R7303 Prediabetes: Secondary | ICD-10-CM

## 2020-12-04 DIAGNOSIS — I2510 Atherosclerotic heart disease of native coronary artery without angina pectoris: Secondary | ICD-10-CM

## 2020-12-04 DIAGNOSIS — E785 Hyperlipidemia, unspecified: Secondary | ICD-10-CM

## 2020-12-04 DIAGNOSIS — I255 Ischemic cardiomyopathy: Secondary | ICD-10-CM

## 2020-12-04 DIAGNOSIS — Z79899 Other long term (current) drug therapy: Secondary | ICD-10-CM

## 2020-12-04 NOTE — Patient Instructions (Signed)
Medication Instructions:  Your physician recommends that you continue on your current medications as directed. Please refer to the Current Medication list given to you today.  *If you need a refill on your cardiac medications before your next appointment, please call your pharmacy*   Lab Work: Your physician recommends that you return for lab work in: 2 months ( 01/2021)   If you have labs (blood work) drawn today and your tests are completely normal, you will receive your results only by: MyChart Message (if you have MyChart) OR A paper copy in the mail If you have any lab test that is abnormal or we need to change your treatment, we will call you to review the results.   Testing/Procedures: Your physician has requested that you have an echocardiogram in 3 months. Echocardiography is a painless test that uses sound waves to create images of your heart. It provides your doctor with information about the size and shape of your heart and how well your heart's chambers and valves are working. This procedure takes approximately one hour. There are no restrictions for this procedure.    Follow-Up: At [facility], you and your health needs are our priority.  As part of our continuing mission to provide you with exceptional heart care, we have created designated Provider Care Teams.  These Care Teams include your primary Cardiologist (physician) and Advanced Practice Providers (APPs -  Physician Assistants and Nurse Practitioners) who all work together to provide you with the care you need, when you need it.  We recommend signing up for the patient portal called "MyChart".  Sign up information is provided on this After Visit Summary.  MyChart is used to connect with patients for Virtual Visits (Telemedicine).  Patients are able to view lab/test results, encounter notes, upcoming appointments, etc.  Non-urgent messages can be sent to your provider as well.   To learn more about what you can do  with MyChart, go to [email or web address].    Your next appointment:   3 month(s)  The format for your next appointment:   In Person  Provider:   Nona Dell, MD or Randall An, PA-C   Other Instructions Thank you for choosing [facility]!

## 2020-12-04 NOTE — Progress Notes (Signed)
Placement   Cardiology Office Note    Date:  12/04/2020   ID:  Devin Jackson, DOB 09/07/1960, MRN [id number]  PCP:  [facility]  Cardiologist: Nona Dell, MD    Chief Complaint  Patient presents with   Hospitalization Follow-up    S/p NSTEMI    History of Present Illness:    Devin Jackson is a 59 y.o. male with past medical history of HTN and family history of CAD who presents to the office today for hospital follow-up.  He most recently presented to [facility] ED on 11/11/2020 for evaluation of worsening chest pain with associated discomfort down his arms which had been occurring intermittently over the past 2 weeks. His troponin values were found to be elevated at 92 and 86 and given this along with his concerning symptoms for unstable angina, a cardiac catheterization was recommended for definitive evaluation. This showed severe single-vessel disease with 100% occluded OM 2 with very faint collaterals and he underwent DES placement to the vessel. Was recommend to be on DAPT with ASA and Brilinta for at least 12 months. Echocardiogram did show his EF was mildly reduced at 45 to 50% with grade 1 diastolic dysfunction and no significant valve abnormalities. He was not placed on a beta-blocker given baseline bradycardia.  Was discharged on Amlodipine, ASA, Atorvastatin, Brilinta and SL NTG.  In talking with the patient today, he reports "feeling great" since his recent hospitalization. Says that he had been experiencing worsening fatigue and dyspnea on exertion for several months prior to his event and his respiratory status and energy level have significantly improved since stent placement. He denies any recurrent chest pain.  No recent orthopnea, PND or lower extremity edema. He did experience occasional palpitations following hospital discharge but no recent symptoms over the past 2 weeks. He has made significant dietary changes and has decreased his intake of sodium and  also his intake of carbohydrates.  He reports good compliance with ASA and Brilinta and has not missed any doses per his report. He does experience easy bruising but no melena, hematochezia or hematuria.  Past Medical History:  Diagnosis Date   CAD (coronary artery disease)    a. s/p NSTEMI in 10/2020 with DES to OM2   Hypertension    Ischemic cardiomyopathy    a. EF 45-50% by echo in 10/2020    Past Surgical History:  Procedure Laterality Date   CORONARY STENT INTERVENTION N/A 11/11/2020   Procedure: CORONARY STENT INTERVENTION;  Surgeon: Yvonne Kendall, MD;  Location: [facility] INVASIVE CV LAB;  Service: Cardiovascular;  Laterality: N/A;   LEFT HEART CATH AND CORONARY ANGIOGRAPHY N/A 11/11/2020   Procedure: LEFT HEART CATH AND CORONARY ANGIOGRAPHY;  Surgeon: Yvonne Kendall, MD;  Location: [facility] INVASIVE CV LAB;  Service: Cardiovascular;  Laterality: N/A;   NECK SURGERY Left 2001    Current Medications: Outpatient Medications Prior to Visit  Medication Sig Dispense Refill   amLODipine (NORVASC) 5 MG tablet Take 1 tablet (5 mg total) by mouth daily. 30 tablet 11   aspirin 81 MG EC tablet Take 1 tablet (81 mg total) by mouth daily. Swallow whole. 30 tablet 11   atorvastatin (LIPITOR) 80 MG tablet Take 1 tablet (80 mg total) by mouth daily. 30 tablet 11   famotidine (PEPCID) 20 MG tablet Take 20 mg by mouth daily.     nitroGLYCERIN (NITROSTAT) 0.4 MG SL tablet Place 1 tablet (0.4 mg total) under the tongue every 5 (five) minutes x 3  doses as needed for chest pain. 25 tablet 12   ticagrelor (BRILINTA) 90 MG TABS tablet Take 1 tablet (90 mg total) by mouth 2 (two) times daily. 60 tablet 11   albuterol (VENTOLIN HFA) 108 (90 Base) MCG/ACT inhaler Inhale 2 puffs into the lungs every 6 (six) hours as needed for wheezing or shortness of breath. (Patient not taking: No sig reported) 8 g 2   guaiFENesin-dextromethorphan (ROBITUSSIN DM) 100-10 MG/5ML syrup Take 10 mLs by mouth every 4 (four) hours as  needed for cough. (Patient not taking: No sig reported) 118 mL 0   No facility-administered medications prior to visit.     Allergies:   Morphine and related   Social History   Socioeconomic History   Marital status: Married    Spouse name: Not on file   Number of children: Not on file   Years of education: Not on file   Highest education level: Not on file  Occupational History   Not on file  Tobacco Use   Smoking status: Never   Smokeless tobacco: Never  Vaping Use   Vaping Use: Never used  Substance and Sexual Activity   Alcohol use: Not Currently    Alcohol/week: 10.0 standard drinks    Types: 10 Cans of beer per week    Comment: Over 1 week   Drug use: Never   Sexual activity: Yes  Other Topics Concern   Not on file  Social History Narrative   Not on file   Social Determinants of Health   Financial Resource Strain: Not on file  Food Insecurity: Not on file  Transportation Needs: Not on file  Physical Activity: Not on file  Stress: Not on file  Social Connections: Not on file     Family History:  The patient's family history includes Arrhythmia in his sister; Heart attack (age of onset: 59) in his brother; Heart attack (age of onset: 30) in his mother.   Review of Systems:    Please see the history of present illness.     All other systems reviewed and are otherwise negative except as noted above.   Physical Exam:    VS:  BP 118/74   Pulse 70   Ht 5\' 9"  (1.753 m)   Wt 171 lb 3.2 oz (77.7 kg)   SpO2 97%   BMI 25.28 kg/m    General: Well developed, well nourished,male appearing in no acute distress. Head: Normocephalic, atraumatic. Neck: No carotid bruits. JVD not elevated.  Lungs: Respirations regular and unlabored, without wheezes or rales.  Heart: Regular rate and rhythm. No S3 or S4.  No murmur, no rubs, or gallops appreciated. Abdomen: Appears non-distended. No obvious abdominal masses. Msk:  Strength and tone appear normal for age. No  obvious joint deformities or effusions. Extremities: No clubbing or cyanosis. No pitting edema.  Distal pedal pulses are 2+ bilaterally. Radial site stable without ecchymosis or evidence of a hematoma.  Neuro: Alert and oriented X 3. Moves all extremities spontaneously. No focal deficits noted. Psych:  Responds to questions appropriately with a normal affect. Skin: No rashes or lesions noted  Wt Readings from Last 3 Encounters:  12/04/20 171 lb 3.2 oz (77.7 kg)  11/12/20 171 lb 3.2 oz (77.7 kg)  11/18/19 170 lb (77.1 kg)     Studies/Labs Reviewed:   EKG:  EKG is not ordered today. EKG from 11/12/2020 is reviewed and shows NSR, HR 65 with LAD. No acute ST changes when compared to prior tracings.  Recent Labs: 11/12/2020: BUN 14; Creatinine, Ser 1.35; Hemoglobin 16.0; Platelets 177; Potassium 4.2; Sodium 134   Lipid Panel    Component Value Date/Time   CHOL 238 (H) 11/12/2020 0423   TRIG 456 (H) 11/12/2020 0423   HDL 33 (L) 11/12/2020 0423   CHOLHDL 7.2 11/12/2020 0423   VLDL UNABLE TO CALCULATE IF TRIGLYCERIDE OVER 400 mg/dL 25/85/2778 2423   LDLCALC UNABLE TO CALCULATE IF TRIGLYCERIDE OVER 400 mg/dL 53/61/4431 5400   LDLDIRECT 147.5 (H) 11/12/2020 0423    Additional studies/ records that were reviewed today include:   Echocardiogram: 10/2020 IMPRESSIONS     1. Left ventricular ejection fraction, by estimation, is 45 to 50%. The  left ventricle has mildly decreased function. The left ventricle  demonstrates regional wall motion abnormalities (see scoring  diagram/findings for description). Left ventricular  diastolic parameters are consistent with Grade I diastolic dysfunction  (impaired relaxation).   2. Right ventricular systolic function is normal. The right ventricular  size is normal.   3. The mitral valve is normal in structure. Trivial mitral valve  regurgitation. No evidence of mitral stenosis.   4. The aortic valve is normal in structure. Aortic valve  regurgitation is  mild to moderate. No aortic stenosis is present.   5. The inferior vena cava is normal in size with greater than 50%  respiratory variability, suggesting right atrial pressure of 3 mmHg.   Cardiac Catheterization: 10/2020 Conclusions: Severe single vessel coronary artery disease with 100% occluded OM2 (likely subacute) with very faint right-to-left and left-to-left collaterals.  Minimal luminal irregularities were also noted in the RCA. Mildly elevated left ventricular filling pressure (LVEDP 15-20 mmHg). Challenging but successful PCI to OM2 using Onyx Frontier 2.0 x 22 mm drug-eluting stent with 0% residual stenosis and TIMI-3 flow.   Recommendations: Dual antiplatelet therapy with aspirin and ticagrelor for at least 12 months. Aggressive secondary prevention.  Assessment:    1. Coronary artery disease involving native coronary artery of native heart without angina pectoris   2. Ischemic cardiomyopathy   3. Hyperlipidemia LDL goal <70   4. Prediabetes   5. Medication management      Plan:   In order of problems listed above:  1. CAD - He is s/p NSTEMI in 10/2020 with catheterization showing 100% occluded OM 2 with very faint collaterals and he underwent DES placement to the vessel.  - He has overall progressed well since returning home and reports significant improvement in his dyspnea and fatigue. He did have occasional palpitations following his procedure which sound most consistent with PVC's by his description but denies any recurrent symptoms over the past few weeks. I encouraged him to make [place] aware if he has recurrent symptoms as a ZIO monitor could be placed to assess for any significant arrhythmias.   - Continue ASA 81 mg daily, Brilinta 90 mg twice daily and Atorvastatin 80 mg daily. He also has a prescription for  SL NTG and we reviewed how to use this. Patient assistance paperwork for [employer] was completed today. If not approved, would need to switch  to Plavix.   2. Ischemic Cardiomyopathy - His EF was mildly reduced at 45 to 50% by echocardiogram during his admission. He denies any recent orthopnea, PND or pitting edema and appears euvolemic on examination today. - He has not been on a beta-blocker given baseline bradycardia during his admission. Will plan for a limited echocardiogram in 3 months for reassessment of his EF. If this remains reduced, would plan to  add an SGLT2 inhibitor (would need to apply for patient assistance given no current insurance coverage).  3. HLD - FLP during his admission showed total cholesterol 238, triglycerides 456,  HDL 33 and LDL 147.  He was started on Atorvastatin 80 mg daily and has been tolerating this well without any side effects. Will recheck FLP and LFT's in 2 months.   4. Prediabetes - His Hgb A1c was elevated to 6.6 in 10/2020 which was actually in a diabetic range. He has made significant dietary changes since and has reduced his intake of regular sodas, pasta and bread. Will recheck with repeat labs in 2 months.     Medication Adjustments/Labs and Tests Ordered: Current medicines are reviewed at length with the patient today.  Concerns regarding medicines are outlined above.  Medication changes, Labs and Tests ordered today are listed in the Patient Instructions below. Patient Instructions  Medication Instructions:  Your physician recommends that you continue on your current medications as directed. Please refer to the Current Medication list given to you today.  *If you need a refill on your cardiac medications before your next appointment, please call your pharmacy*   Lab Work: Your physician recommends that you return for lab work in: 2 months ( 01/2021)   If you have labs (blood work) drawn today and your tests are completely normal, you will receive your results only by: MyChart Message (if you have MyChart) OR A paper copy in the mail If you have any lab test that is abnormal or we  need to change your treatment, we will call you to review the results.   Testing/Procedures: Your physician has requested that you have an echocardiogram in 3 months. Echocardiography is a painless test that uses sound waves to create images of your heart. It provides your doctor with information about the size and shape of your heart and how well your heart's chambers and valves are working. This procedure takes approximately one hour. There are no restrictions for this procedure.    Follow-Up: At [facility], you and your health needs are our priority.  As part of our continuing mission to provide you with exceptional heart care, we have created designated Provider Care Teams.  These Care Teams include your primary Cardiologist (physician) and Advanced Practice Providers (APPs -  Physician Assistants and Nurse Practitioners) who all work together to provide you with the care you need, when you need it.  We recommend signing up for the patient portal called "MyChart".  Sign up information is provided on this After Visit Summary.  MyChart is used to connect with patients for Virtual Visits (Telemedicine).  Patients are able to view lab/test results, encounter notes, upcoming appointments, etc.  Non-urgent messages can be sent to your provider as well.   To learn more about what you can do with MyChart, go to [email or web address].    Your next appointment:   3 month(s)  The format for your next appointment:   In Person  Provider:   Nona Dell, MD or Randall An, PA-C   Other Instructions Thank you for choosing [facility]!     Signed, Ellsworth Lennox, PA-C  12/04/2020 5:21 PM    [facility] [address] [place], [place] [address] Phone: [phone] Fax: [phone]

## 2021-01-01 ENCOUNTER — Telehealth: Payer: Self-pay | Admitting: Student

## 2021-01-01 NOTE — Telephone Encounter (Signed)
Patient called stating that [place] said she was going to try and help him with Brilinta.  He states when he came for his visit the hospital had given him some paperwork to have signed at his last visit.

## 2021-01-01 NOTE — Telephone Encounter (Signed)
Advised pt that nurse that handles patient assistance was not available today. Will call pt back when I have update on application. Pt verbalized understanding.

## 2021-01-07 ENCOUNTER — Telehealth: Payer: Self-pay | Admitting: Cardiology

## 2021-01-07 NOTE — Telephone Encounter (Signed)
Returned call to pt and informed pt that we could resubmit the application. Pt will stop by [facility] office for samples and to complete patient assistance forms. 3 Sample bottle of Brilinta 90 mg Lot # [id number], Exp: 12/2021.

## 2021-01-07 NOTE — Telephone Encounter (Signed)
Called [employer] to get update on pt assistance application. On hold for 45 mins with no answer. Will try again later.

## 2021-01-07 NOTE — Telephone Encounter (Signed)
Patient is calling saying that he was denied for the Brilinta.  However the dated he was given for the application over the phone, is not near the date he completed the form.  He wants to know what he should do.

## 2021-01-21 ENCOUNTER — Telehealth: Payer: Self-pay | Admitting: Cardiology

## 2021-01-21 MED ORDER — CLOPIDOGREL BISULFATE 75 MG PO TABS: ORAL_TABLET | ORAL | 3 refills | Status: AC

## 2021-01-21 NOTE — Telephone Encounter (Signed)
Pt notified of medication change and how to take. Order placed to [facility].

## 2021-01-21 NOTE — Telephone Encounter (Signed)
Spoke with Pt who states that he completed patient assistance forms and was denied. Pt requesting that Brilinta be switched to another medication. Please advise.

## 2021-01-21 NOTE — Telephone Encounter (Signed)
Pt c/o medication issue:  1. Name of Medication: ticagrelor (BRILINTA) 90 MG TABS tablet  2. How are you currently taking this medication (dosage and times per day)? AS DIRECTED  3. Are you having a reaction (difficulty breathing--STAT)? NO  4. What is your medication issue? PT IS CALLING TO SEE IF HE CAN GET SOME PT ASSISTANCE WITH BRILINTA. HE IS A SELF PAY PATIENT WITH NO COVERAGE

## 2021-02-17 ENCOUNTER — Encounter (HOSPITAL_COMMUNITY): Payer: Self-pay

## 2021-02-17 ENCOUNTER — Other Ambulatory Visit: Payer: Self-pay

## 2021-02-17 ENCOUNTER — Emergency Department (HOSPITAL_COMMUNITY)
Admission: EM | Admit: 2021-02-17 | Discharge: 2021-02-17 | Disposition: A | Discharge status: Home / Self Care | Payer: Medicaid Other | Attending: Emergency Medicine | Admitting: Emergency Medicine

## 2021-02-17 DIAGNOSIS — Z7982 Long term (current) use of aspirin: Secondary | ICD-10-CM | POA: Insufficient documentation

## 2021-02-17 DIAGNOSIS — Z7902 Long term (current) use of antithrombotics/antiplatelets: Secondary | ICD-10-CM | POA: Insufficient documentation

## 2021-02-17 DIAGNOSIS — Z79899 Other long term (current) drug therapy: Secondary | ICD-10-CM | POA: Insufficient documentation

## 2021-02-17 DIAGNOSIS — R040 Epistaxis: Secondary | ICD-10-CM

## 2021-02-17 LAB — CBC WITH DIFFERENTIAL/PLATELET
Abs Immature Granulocytes: 0.04 10*3/uL (ref 0.00–0.07)
Basophils Absolute: 0.1 10*3/uL (ref 0.0–0.1)
Basophils Relative: 1 %
Eosinophils Absolute: 0.2 10*3/uL (ref 0.0–0.5)
Eosinophils Relative: 3 %
HCT: 46.4 % (ref 39.0–52.0)
Hemoglobin: 15.5 g/dL (ref 13.0–17.0)
Immature Granulocytes: 1 %
Lymphocytes Relative: 15 %
Lymphs Abs: 1 10*3/uL (ref 0.7–4.0)
MCH: 28.8 pg (ref 26.0–34.0)
MCHC: 33.4 g/dL (ref 30.0–36.0)
MCV: 86.2 fL (ref 80.0–100.0)
Monocytes Absolute: 0.7 10*3/uL (ref 0.1–1.0)
Monocytes Relative: 10 %
Neutro Abs: 4.8 10*3/uL (ref 1.7–7.7)
Neutrophils Relative %: 70 %
Platelets: 196 10*3/uL (ref 150–400)
RBC: 5.38 MIL/uL (ref 4.22–5.81)
RDW: 13.5 % (ref 11.5–15.5)
WBC: 6.9 10*3/uL (ref 4.0–10.5)
nRBC: 0 % (ref 0.0–0.2)

## 2021-02-17 LAB — COMPREHENSIVE METABOLIC PANEL
ALT: 38 U/L (ref 0–44)
AST: 25 U/L (ref 15–41)
Albumin: 3.8 g/dL (ref 3.5–5.0)
Alkaline Phosphatase: 109 U/L (ref 38–126)
Anion gap: 7 (ref 5–15)
BUN: 20 mg/dL (ref 8–23)
CO2: 24 mmol/L (ref 22–32)
Calcium: 8.8 mg/dL — ABNORMAL LOW (ref 8.9–10.3)
Chloride: 104 mmol/L (ref 98–111)
Creatinine, Ser: 0.99 mg/dL (ref 0.61–1.24)
GFR, Estimated: >60 mL/min (ref >60)
Glucose, Bld: 141 mg/dL — ABNORMAL HIGH (ref 70–99)
Potassium: 4.1 mmol/L (ref 3.5–5.1)
Sodium: 135 mmol/L (ref 135–145)
Total Bilirubin: 0.4 mg/dL (ref 0.3–1.2)
Total Protein: 7.3 g/dL (ref 6.5–8.1)

## 2021-02-17 LAB — PROTIME-INR
INR: 1 (ref 0.8–1.2)
Prothrombin Time: 12.7 seconds (ref 11.4–15.2)

## 2021-02-17 MED ORDER — OXYMETAZOLINE HCL 0.05 % NA SOLN
3.0000 | Freq: Once | NASAL | Status: AC
  Administered 2021-02-17: 3 via NASAL
  Filled 2021-02-17: qty 30

## 2021-02-17 NOTE — ED Provider Notes (Signed)
°  7:13 AM Patient signed out to me by previous ED physician. 61 yo male on plavix on asa for NSTEMI in Sept 2022 presenting for epistaxis.  Pending: Hemoglobin  Plan: control bleeding and DC if stable.    Physical Exam  BP (!) 138/96 (BP Location: Right Arm)    Pulse 60    Temp 98.4 F (36.9 C) (Oral)    Resp 16    Ht 5\' 9"  (1.753 m)    Wt 72.6 kg    SpO2 97%    BMI 23.63 kg/m   Physical Exam Vitals and nursing note reviewed.  Constitutional:      Appearance: Normal appearance.  HENT:     Head: Normocephalic and atraumatic.  Cardiovascular:     Rate and Rhythm: Normal rate and regular rhythm.  Pulmonary:     Effort: Pulmonary effort is normal.     Breath sounds: Normal breath sounds.  Skin:    Capillary Refill: Capillary refill takes less than 2 seconds.  Neurological:     General: No focal deficit present.     Mental Status: He is alert and oriented to person, place, and time.    ED Course/Procedures     Procedures  MDM    11:26 AM Hemoglobin stable. Epistaxis controlled x 3.5 hours with no repeat bleeds.I recommend patient to continue taking his home medications as prescribed at this time.   Patient in no distress and overall condition improved here in the ED. Detailed discussions were had with the patient regarding current findings, and need for close f/u with PCP or on call doctor. The patient has been instructed to return immediately if the symptoms worsen in any way for re-evaluation. Patient verbalized understanding and is in agreement with current care plan. All questions answered prior to discharge.       P, DO 02/17/21 1127

## 2021-02-17 NOTE — ED Triage Notes (Signed)
Pt arrived via POV with c/o epistaxis that began around 4 am this morning. Pt states that he has been having these episodes for about 2 weeks now. Started on Plavix on 01/21/21

## 2021-02-17 NOTE — ED Provider Notes (Signed)
[facility] EMERGENCY DEPARTMENT Provider Note   CSN: [id number] Arrival date & time: 02/17/21  0601     History  Chief Complaint  Patient presents with   Epistaxis    Devin Jackson is a 61 y.o. male.   Epistaxis Location:  Bilateral Severity:  Moderate Duration:  5 weeks Timing:  Intermittent Progression:  Waxing and waning Chronicity:  New Context: aspirin use   Context: not bleeding disorder, not CPAP, not home oxygen, not nose picking, not recent infection and not thrombocytopenia   Relieved by:  None tried Worsened by:  Nothing Ineffective treatments:  Applying pressure Associated symptoms: no fever       Home Medications Prior to Admission medications   Medication Sig Start Date End Date Taking? Authorizing Provider  albuterol (VENTOLIN HFA) 108 (90 Base) MCG/ACT inhaler Inhale 2 puffs into the lungs every 6 (six) hours as needed for wheezing or shortness of breath. Patient not taking: No sig reported 11/20/19   Erick Blinks, MD  amLODipine (NORVASC) 5 MG tablet Take 1 tablet (5 mg total) by mouth daily. 11/13/20   Barrett, Joline Salt, PA-C  aspirin 81 MG EC tablet Take 1 tablet (81 mg total) by mouth daily. Swallow whole. 11/13/20   Barrett, Joline Salt, PA-C  atorvastatin (LIPITOR) 80 MG tablet Take 1 tablet (80 mg total) by mouth daily. 11/13/20   Barrett, Joline Salt, PA-C  clopidogrel (PLAVIX) 75 MG tablet Take 300 mg ( 4 Tablets) on Day one and then One tablet daily there after. 01/21/21   Jonelle Sidle, MD  famotidine (PEPCID) 20 MG tablet Take 20 mg by mouth daily.    [provider]  guaiFENesin-dextromethorphan (ROBITUSSIN DM) 100-10 MG/5ML syrup Take 10 mLs by mouth every 4 (four) hours as needed for cough. Patient not taking: No sig reported 11/20/19   Erick Blinks, MD  nitroGLYCERIN (NITROSTAT) 0.4 MG SL tablet Place 1 tablet (0.4 mg total) under the tongue every 5 (five) minutes x 3 doses as needed for chest pain. 11/12/20   Barrett, Joline Salt, PA-C       Allergies    Morphine and related    Review of Systems   Review of Systems  Constitutional:  Negative for fever.  HENT:  Positive for nosebleeds.   All other systems reviewed and are negative.  Physical Exam Updated Vital Signs BP (!) 138/96 (BP Location: Right Arm)    Pulse 60    Temp 98.4 F (36.9 C) (Oral)    Resp 16    Ht 5\' 9"  (1.753 m)    Wt 72.6 kg    SpO2 97%    BMI 23.63 kg/m  Physical Exam Vitals and nursing note reviewed.  Constitutional:      Appearance: He is well-developed.  HENT:     Head: Normocephalic and atraumatic.     Nose: No congestion.     Mouth/Throat:     Mouth: Mucous membranes are dry.  Eyes:     Pupils: Pupils are equal, round, and reactive to light.  Cardiovascular:     Rate and Rhythm: Normal rate.  Pulmonary:     Effort: Pulmonary effort is normal. No respiratory distress.  Abdominal:     General: Abdomen is flat. There is no distension.  Musculoskeletal:        General: Normal range of motion.     Cervical back: Normal range of motion.  Skin:    General: Skin is warm and dry.  Neurological:  General: No focal deficit present.     Mental Status: He is alert.    ED Results / Procedures / Treatments   Labs (all labs ordered are listed, but only abnormal results are displayed) Labs Reviewed  CBC WITH DIFFERENTIAL/PLATELET  COMPREHENSIVE METABOLIC PANEL  PROTIME-INR    EKG None  Radiology No results found.  Procedures .Epistaxis Management  Date/Time: 02/17/2021 7:24 AM Performed by: Marily Memos, MD Authorized by: Marily Memos, MD   Consent:    Consent obtained:  Verbal   Consent given by:  Patient   Risks, benefits, and alternatives were discussed: yes     Risks discussed:  Bleeding, infection, nasal injury and pain   Alternatives discussed:  No treatment Universal protocol:    Procedure explained and questions answered to patient or proxy's satisfaction: yes     Relevant documents present and verified: yes      Patient identity confirmed:  Verbally with patient Anesthesia:    Anesthesia method:  None Procedure details:    Treatment site:  L anterior   Treatment method:  Thrombin   Treatment complexity:  Limited   Treatment episode: initial   Post-procedure details:    Assessment:  Bleeding stopped   Procedure completion:  Tolerated Comments:     Constant pressure and afrin.     Medications Ordered in ED Medications  oxymetazoline (AFRIN) 0.05 % nasal spray 3 spray (3 sprays Each Nare Given 02/17/21 1610)    ED Course/ Medical Decision Making/ A&P                           Medical Decision Making  Recurrent nose bleeds for quite awhile. Will check labs. Epistaxis ceased with above measures. No indication for nasal balloon at this time. Will d/w cardiology regarding DAPT.        Final Clinical Impression(s) / ED Diagnoses Final diagnoses:  None    Rx / DC Orders ED Discharge Orders     None         Diva Lemberger, Barbara Cower, MD 02/17/21 [phone]

## 2021-02-17 NOTE — Discharge Instructions (Signed)
Continue meds as prescribed.

## 2021-02-20 ENCOUNTER — Telehealth: Payer: Self-pay

## 2021-02-20 DIAGNOSIS — E785 Hyperlipidemia, unspecified: Secondary | ICD-10-CM

## 2021-02-20 DIAGNOSIS — R040 Epistaxis: Secondary | ICD-10-CM

## 2021-02-20 NOTE — Telephone Encounter (Addendum)
-----   Message from Jonelle Sidle, MD sent at 02/19/2021  5:11 PM EST ----- Results reviewed.  LDL is down to 101 from 147 on high-dose Lipitor.  If he has been consistent with therapy, we probably need to check with the lipid clinic regarding his coverage for PCSK9 inhibitor if possible.  Doubt that even adding Zetia would get him close enough to goal.    Discussed with patient, referral placed to Lipid clinic

## 2021-02-20 NOTE — Telephone Encounter (Signed)
Patient recently seen in ED for epistaxis. Discharged with instructions to discuss DAPT with cardiology.   I will message Dr.McDowell   Patient reports nightly epistaxis from left nares for the past month. Typically stops with applied pressure States this happened 10 years ago and had cauterization done by ENT.

## 2021-02-20 NOTE — Telephone Encounter (Signed)
Jonelle Sidle, MD   Unspecified (Today,  9:01 AM) He just underwent DES to the OM 2 in September 2022 with recommendation by interventional cardiologist to stay on aspirin and ticagrelor for 12 months.  Would make a referral to ENT to see if there is a reversible cause for his epistaxis.          Referral placed to ENT, patient made aware they will call him to schedule appointment.

## 2021-03-10 ENCOUNTER — Other Ambulatory Visit: Payer: Self-pay

## 2021-03-10 ENCOUNTER — Ambulatory Visit (HOSPITAL_COMMUNITY)
Admission: RE | Admit: 2021-03-10 | Discharge: 2021-03-10 | Disposition: A | Discharge status: Home / Self Care | Payer: Self-pay | Source: Ambulatory Visit | Attending: Student | Admitting: Student

## 2021-03-10 DIAGNOSIS — I255 Ischemic cardiomyopathy: Secondary | ICD-10-CM | POA: Insufficient documentation

## 2021-03-10 LAB — ECHOCARDIOGRAM LIMITED
Calc EF: 61.1 %
S' Lateral: 2.8 cm
Single Plane A2C EF: 55.8 %
Single Plane A4C EF: 65.6 %

## 2021-03-10 NOTE — Progress Notes (Signed)
*  PRELIMINARY RESULTS* Echocardiogram Limited 2D Echocardiogram has been performed.  Carolyne Fiscal 03/10/2021, 11:17 AM

## 2021-03-25 ENCOUNTER — Ambulatory Visit (INDEPENDENT_AMBULATORY_CARE_PROVIDER_SITE_OTHER): Payer: Self-pay | Admitting: Cardiology

## 2021-03-25 ENCOUNTER — Encounter: Payer: Self-pay | Admitting: Cardiology

## 2021-03-25 ENCOUNTER — Other Ambulatory Visit: Payer: Self-pay

## 2021-03-25 VITALS — BP 132/74 | HR 68 | Ht 69.0 in | Wt 171.0 lb

## 2021-03-25 DIAGNOSIS — I25119 Atherosclerotic heart disease of native coronary artery with unspecified angina pectoris: Secondary | ICD-10-CM

## 2021-03-25 DIAGNOSIS — E785 Hyperlipidemia, unspecified: Secondary | ICD-10-CM

## 2021-03-25 DIAGNOSIS — I255 Ischemic cardiomyopathy: Secondary | ICD-10-CM

## 2021-03-25 MED ORDER — EZETIMIBE 10 MG PO TABS: 10.0000 mg | ORAL_TABLET | Freq: Every day | ORAL | 3 refills | Status: AC

## 2021-03-25 NOTE — Progress Notes (Signed)
Cardiology Office Note  Date: 03/25/2021   ID: [name], [name] June 15, 1960, MRN [id number]  PCP:  [facility], [facility]  Cardiologist:  Nona Dell, MD Electrophysiologist:  None   Chief Complaint  Patient presents with   Cardiac follow-up    History of Present Illness: Devin Jackson is a 61 y.o. male last seen in October 2022 by Ms. Strader PA-C.  He is here for a routine follow-up visit.  He does not report any angina symptoms and no breathlessness beyond NYHA class I at this point.  He walks for about an hour each day.  Recent follow-up echocardiogram revealed normalization of LVEF in the range of 60 to 65%.  We discussed this today.  He reports compliance with his medications which are noted below.  No nitroglycerin use.  LDL was 147 in September 2022, repeated in January and down to 101.  Past Medical History:  Diagnosis Date   CAD (coronary artery disease)    a. s/p NSTEMI in 10/2020 with DES to OM2   Hypertension    Ischemic cardiomyopathy    a. EF 45-50% by echo in 10/2020    Past Surgical History:  Procedure Laterality Date   CORONARY STENT INTERVENTION N/A 11/11/2020   Procedure: CORONARY STENT INTERVENTION;  Surgeon: Yvonne Kendall, MD;  Location: [facility];  Service: Cardiovascular;  Laterality: N/A;   LEFT HEART CATH AND CORONARY ANGIOGRAPHY N/A 11/11/2020   Procedure: LEFT HEART CATH AND CORONARY ANGIOGRAPHY;  Surgeon: Yvonne Kendall, MD;  Location: [facility];  Service: Cardiovascular;  Laterality: N/A;   NECK SURGERY Left 2001    Current Outpatient Medications  Medication Sig Dispense Refill   amLODipine (NORVASC) 5 MG tablet Take 1 tablet (5 mg total) by mouth daily. 30 tablet 11   aspirin 81 MG EC tablet Take 1 tablet (81 mg total) by mouth daily. Swallow whole. 30 tablet 11   atorvastatin (LIPITOR) 80 MG tablet Take 1 tablet (80 mg total) by mouth daily. 30 tablet 11   clopidogrel (PLAVIX) 75 MG tablet Take 300 mg ( 4  Tablets) on Day one and then One tablet daily there after. 94 tablet 3   ezetimibe (ZETIA) 10 MG tablet Take 1 tablet (10 mg total) by mouth daily. 90 tablet 3   famotidine (PEPCID) 20 MG tablet Take 20 mg by mouth daily.     nitroGLYCERIN (NITROSTAT) 0.4 MG SL tablet Place 1 tablet (0.4 mg total) under the tongue every 5 (five) minutes x 3 doses as needed for chest pain. 25 tablet 12   albuterol (VENTOLIN HFA) 108 (90 Base) MCG/ACT inhaler Inhale 2 puffs into the lungs every 6 (six) hours as needed for wheezing or shortness of breath. (Patient not taking: Reported on 11/11/2020) 8 g 2   guaiFENesin-dextromethorphan (ROBITUSSIN DM) 100-10 MG/5ML syrup Take 10 mLs by mouth every 4 (four) hours as needed for cough. (Patient not taking: Reported on 11/11/2020) 118 mL 0   No current facility-administered medications for this visit.   Allergies:  Morphine and related   ROS: No palpitations or syncope.  Physical Exam: VS:  BP 132/74    Pulse 68    Ht 5\' 9"  (1.753 m)    Wt 171 lb (77.6 kg)    SpO2 96%    BMI 25.25 kg/m , BMI Body mass index is 25.25 kg/m.  Wt Readings from Last 3 Encounters:  03/25/21 171 lb (77.6 kg)  02/17/21 160 lb (72.6 kg)  12/04/20 171 lb 3.2 oz (77.7 kg)    General: Patient appears comfortable at rest. HEENT: Conjunctiva and lids normal, wearing a mask. Neck: Supple, no elevated JVP or carotid bruits, no thyromegaly. Lungs: Clear to auscultation, nonlabored breathing at rest. Cardiac: Regular rate and rhythm, no S3 or significant systolic murmur, no pericardial rub. Extremities: No pitting edema.  ECG:  An ECG dated 11/11/2020 was personally reviewed today and demonstrated:  Sinus bradycardia with LVH.  Recent Labwork: 02/17/2021: ALT 38; AST 25; BUN 20; Creatinine, Ser 0.99; Hemoglobin 15.5; Platelets 196; Potassium 4.1; Sodium 135     Component Value Date/Time   CHOL 238 (H) 11/12/2020 0423   TRIG 456 (H) 11/12/2020 0423   HDL 33 (L) 11/12/2020 0423   CHOLHDL 7.2  11/12/2020 0423   VLDL UNABLE TO CALCULATE IF TRIGLYCERIDE OVER 400 mg/dL 86/57/8469 6295   LDLCALC UNABLE TO CALCULATE IF TRIGLYCERIDE OVER 400 mg/dL 28/41/3244 0102   LDLDIRECT 147.5 (H) 11/12/2020 0423    Other Studies Reviewed Today:  Cardiac catheterization 11/11/2020: Conclusions: Severe single vessel coronary artery disease with 100% occluded OM2 (likely subacute) with very faint right-to-left and left-to-left collaterals.  Minimal luminal irregularities were also noted in the RCA. Mildly elevated left ventricular filling pressure (LVEDP 15-20 mmHg). Challenging but successful PCI to OM2 using Onyx Frontier 2.0 x 22 mm drug-eluting stent with 0% residual stenosis and TIMI-3 flow.   Recommendations: Dual antiplatelet therapy with aspirin and ticagrelor for at least 12 months. Aggressive secondary prevention.  Echocardiogram 03/10/2021:  1. Left ventricular ejection fraction, by estimation, is 60 to 65%. The  left ventricle has normal function. The left ventricle has no regional  wall motion abnormalities. There is mild concentric left ventricular  hypertrophy. Left ventricular diastolic  function could not be evaluated.   2. Right ventricular systolic function is normal. The right ventricular  size is normal. Tricuspid regurgitation signal is inadequate for assessing  PA pressure.   3. The mitral valve is normal in structure.   4. The aortic valve is normal in structure.   5. The inferior vena cava is normal in size with greater than 50%  respiratory variability, suggesting right atrial pressure of 3 mmHg.   Assessment and Plan:  1.  CAD status post NSTEMI in September 2022 with DES intervention to culprit lesion, occluded OM 2.  He is doing very well at this time with no active angina, walking for exercise.  Continue aspirin, Plavix, Norvasc, Lipitor, and as needed nitroglycerin.  2.  Mixed hyperlipidemia, LDL has come down to 101 on Lipitor 80 mg daily.  Adding Zetia 10 mg  daily.  He is also working on diet and exercise.  Recheck FLP for next visit.  3.  Ischemic cardiomyopathy with normalization of LVEF on medical therapy, recent echocardiogram reviewed.  Medication Adjustments/Labs and Tests Ordered: Current medicines are reviewed at length with the patient today.  Concerns regarding medicines are outlined above.   Tests Ordered: Orders Placed This Encounter  Procedures   Lipid Profile    Medication Changes: Meds ordered this encounter  Medications   ezetimibe (ZETIA) 10 MG tablet    Sig: Take 1 tablet (10 mg total) by mouth daily.    Dispense:  90 tablet    Refill:  3    Disposition:  Follow up  6 months.  Signed, Jonelle Sidle, MD, [facility] 03/25/2021 1:18 PM    [facility] at [facility] [address], [place], [place] [address] Phone: [phone]; Fax: [phone]

## 2021-03-25 NOTE — Patient Instructions (Signed)
Medication Instructions:  START Zetia 10 mg daily for cholesterol  Labwork: Fasting Lipids in 6 months JUST BEFORE next visit  Testing/Procedures: None today  Follow-Up: 6 months  Any Other Special Instructions Will Be Listed Below (If Applicable).  If you need a refill on your cardiac medications before your next appointment, please call your pharmacy.

## 2021-10-17 ENCOUNTER — Ambulatory Visit: Payer: Self-pay | Admitting: Cardiology

## 2021-10-17 NOTE — Progress Notes (Deleted)
Cardiology Office Note  Date: 10/17/2021   ID: Devin Jackson, DOB 08/26/1960, MRN [id number]  PCP:  [facility], [facility]  Cardiologist:  Nona Dell, MD Electrophysiologist:  None   No chief complaint on file.   History of Present Illness: Devin Jackson is a 61 y.o. male last seen in February.  Past Medical History:  Diagnosis Date   CAD (coronary artery disease)    a. s/p NSTEMI in 10/2020 with DES to OM2   Hypertension    Ischemic cardiomyopathy    a. EF 45-50% by echo in 10/2020    Past Surgical History:  Procedure Laterality Date   CORONARY STENT INTERVENTION N/A 11/11/2020   Procedure: CORONARY STENT INTERVENTION;  Surgeon: Yvonne Kendall, MD;  [facility];  Service: Cardiovascular;  Laterality: N/A;   LEFT HEART CATH AND CORONARY ANGIOGRAPHY N/A 11/11/2020   Procedure: LEFT HEART CATH AND CORONARY ANGIOGRAPHY;  Surgeon: Yvonne Kendall, MD;  Location: [facility];  Service: Cardiovascular;  Laterality: N/A;   NECK SURGERY Left 2001    Current Outpatient Medications  Medication Sig Dispense Refill   albuterol (VENTOLIN HFA) 108 (90 Base) MCG/ACT inhaler Inhale 2 puffs into the lungs every 6 (six) hours as needed for wheezing or shortness of breath. (Patient not taking: Reported on 11/11/2020) 8 g 2   amLODipine (NORVASC) 5 MG tablet Take 1 tablet (5 mg total) by mouth daily. 30 tablet 11   aspirin 81 MG EC tablet Take 1 tablet (81 mg total) by mouth daily. Swallow whole. 30 tablet 11   atorvastatin (LIPITOR) 80 MG tablet Take 1 tablet (80 mg total) by mouth daily. 30 tablet 11   clopidogrel (PLAVIX) 75 MG tablet Take 300 mg ( 4 Tablets) on Day one and then One tablet daily there after. 94 tablet 3   ezetimibe (ZETIA) 10 MG tablet Take 1 tablet (10 mg total) by mouth daily. 90 tablet 3   famotidine (PEPCID) 20 MG tablet Take 20 mg by mouth daily.     guaiFENesin-dextromethorphan (ROBITUSSIN DM) 100-10 MG/5ML syrup Take 10 mLs by  mouth every 4 (four) hours as needed for cough. (Patient not taking: Reported on 11/11/2020) 118 mL 0   nitroGLYCERIN (NITROSTAT) 0.4 MG SL tablet Place 1 tablet (0.4 mg total) under the tongue every 5 (five) minutes x 3 doses as needed for chest pain. 25 tablet 12   No current facility-administered medications for this visit.   Allergies:  Morphine and related   Social History: The patient  reports that he has never smoked. He has never used smokeless tobacco. He reports that he does not currently use alcohol after a past usage of about 10.0 standard drinks of alcohol per week. He reports that he does not use drugs.   Family History: The patient's family history includes Arrhythmia in his sister; Cancer in his father; Heart attack in his father; Heart attack (age of onset: 60) in his brother; Heart attack (age of onset: 47) in his mother.   ROS:  Please see the history of present illness. Otherwise, complete review of systems is positive for {NONE DEFAULTED:18576}.  All other systems are reviewed and negative.   Physical Exam: VS:  There were no vitals taken for this visit., BMI There is no height or weight on file to calculate BMI.  Wt Readings from Last 3 Encounters:  03/25/21 171 lb (77.6 kg)  02/17/21 160 lb (72.6 kg)  12/04/20 171 lb 3.2 oz (77.7 kg)  General: Patient appears comfortable at rest. HEENT: Conjunctiva and lids normal, oropharynx clear with moist mucosa. Neck: Supple, no elevated JVP or carotid bruits, no thyromegaly. Lungs: Clear to auscultation, nonlabored breathing at rest. Cardiac: Regular rate and rhythm, no S3 or significant systolic murmur, no pericardial rub. Abdomen: Soft, nontender, no hepatomegaly, bowel sounds present, no guarding or rebound. Extremities: No pitting edema, distal pulses 2+. Skin: Warm and dry. Musculoskeletal: No kyphosis. Neuropsychiatric: Alert and oriented x3, affect grossly appropriate.  ECG:  An ECG dated 11/11/2020 was personally  reviewed today and demonstrated:  Sinus bradycardia with LVH.  Recent Labwork: 02/17/2021: ALT 38; AST 25; BUN 20; Creatinine, Ser 0.99; Hemoglobin 15.5; Platelets 196; Potassium 4.1; Sodium 135     Component Value Date/Time   CHOL 238 (H) 11/12/2020 0423   TRIG 456 (H) 11/12/2020 0423   HDL 33 (L) 11/12/2020 0423   CHOLHDL 7.2 11/12/2020 0423   VLDL UNABLE TO CALCULATE IF TRIGLYCERIDE OVER 400 mg/dL 21/19/4174 0814   LDLCALC UNABLE TO CALCULATE IF TRIGLYCERIDE OVER 400 mg/dL 48/18/5631 4970   LDLDIRECT 147.5 (H) 11/12/2020 0423  July 2023: Cholesterol 144, HDL 32, triglycerides 205, LDL 82  Other Studies Reviewed Today:  Cardiac catheterization 11/11/2020: Conclusions: Severe single vessel coronary artery disease with 100% occluded OM2 (likely subacute) with very faint right-to-left and left-to-left collaterals.  Minimal luminal irregularities were also noted in the RCA. Mildly elevated left ventricular filling pressure (LVEDP 15-20 mmHg). Challenging but successful PCI to OM2 using Onyx Frontier 2.0 x 22 mm drug-eluting stent with 0% residual stenosis and TIMI-3 flow.   Recommendations: Dual antiplatelet therapy with aspirin and ticagrelor for at least 12 months. Aggressive secondary prevention.   Echocardiogram 03/10/2021:  1. Left ventricular ejection fraction, by estimation, is 60 to 65%. The  left ventricle has normal function. The left ventricle has no regional  wall motion abnormalities. There is mild concentric left ventricular  hypertrophy. Left ventricular diastolic  function could not be evaluated.   2. Right ventricular systolic function is normal. The right ventricular  size is normal. Tricuspid regurgitation signal is inadequate for assessing  PA pressure.   3. The mitral valve is normal in structure.   4. The aortic valve is normal in structure.   5. The inferior vena cava is normal in size with greater than 50%  respiratory variability, suggesting right atrial  pressure of 3 mmHg.   Assessment and Plan:   Medication Adjustments/Labs and Tests Ordered: Current medicines are reviewed at length with the patient today.  Concerns regarding medicines are outlined above.   Tests Ordered: No orders of the defined types were placed in this encounter.   Medication Changes: No orders of the defined types were placed in this encounter.   Disposition:  Follow up {follow up:15908}  Signed, Jonelle Sidle, MD, [facility] 10/17/2021 7:54 AM    [facility] at [facility] [address], [place], [place] [address] Phone: [phone]; Fax: [phone]

## 2021-11-06 ENCOUNTER — Emergency Department (HOSPITAL_COMMUNITY): Payer: Self-pay

## 2021-11-06 ENCOUNTER — Other Ambulatory Visit: Payer: Self-pay

## 2021-11-06 ENCOUNTER — Emergency Department (HOSPITAL_COMMUNITY)
Admission: EM | Admit: 2021-11-06 | Discharge: 2021-11-06 | Disposition: A | Discharge status: Home / Self Care | Payer: Self-pay | Attending: Emergency Medicine | Admitting: Emergency Medicine

## 2021-11-06 ENCOUNTER — Encounter (HOSPITAL_COMMUNITY): Payer: Self-pay

## 2021-11-06 DIAGNOSIS — Z7982 Long term (current) use of aspirin: Secondary | ICD-10-CM | POA: Insufficient documentation

## 2021-11-06 DIAGNOSIS — R1031 Right lower quadrant pain: Secondary | ICD-10-CM | POA: Insufficient documentation

## 2021-11-06 DIAGNOSIS — N201 Calculus of ureter: Secondary | ICD-10-CM | POA: Insufficient documentation

## 2021-11-06 LAB — CBC WITH DIFFERENTIAL/PLATELET
Abs Immature Granulocytes: 0.08 10*3/uL — ABNORMAL HIGH (ref 0.00–0.07)
Basophils Absolute: 0.1 10*3/uL (ref 0.0–0.1)
Basophils Relative: 1 %
Eosinophils Absolute: 0 10*3/uL (ref 0.0–0.5)
Eosinophils Relative: 0 %
HCT: 46.9 % (ref 39.0–52.0)
Hemoglobin: 16.1 g/dL (ref 13.0–17.0)
Immature Granulocytes: 1 %
Lymphocytes Relative: 7 %
Lymphs Abs: 1 10*3/uL (ref 0.7–4.0)
MCH: 29.1 pg (ref 26.0–34.0)
MCHC: 34.3 g/dL (ref 30.0–36.0)
MCV: 84.8 fL (ref 80.0–100.0)
Monocytes Absolute: 0.7 10*3/uL (ref 0.1–1.0)
Monocytes Relative: 5 %
Neutro Abs: 12.7 10*3/uL — ABNORMAL HIGH (ref 1.7–7.7)
Neutrophils Relative %: 86 %
Platelets: 234 10*3/uL (ref 150–400)
RBC: 5.53 MIL/uL (ref 4.22–5.81)
RDW: 13.6 % (ref 11.5–15.5)
WBC: 14.5 10*3/uL — ABNORMAL HIGH (ref 4.0–10.5)
nRBC: 0 % (ref 0.0–0.2)

## 2021-11-06 LAB — URINALYSIS, ROUTINE W REFLEX MICROSCOPIC
Bilirubin Urine: NEGATIVE
Glucose, UA: 50 mg/dL — AB
Ketones, ur: NEGATIVE mg/dL
Leukocytes,Ua: NEGATIVE
Nitrite: NEGATIVE
Protein, ur: 30 mg/dL — AB
Specific Gravity, Urine: 1.017 (ref 1.005–1.030)
pH: 7 (ref 5.0–8.0)

## 2021-11-06 LAB — BASIC METABOLIC PANEL
Anion gap: 8 (ref 5–15)
BUN: 17 mg/dL (ref 8–23)
CO2: 25 mmol/L (ref 22–32)
Calcium: 9.6 mg/dL (ref 8.9–10.3)
Chloride: 104 mmol/L (ref 98–111)
Creatinine, Ser: 1.48 mg/dL — ABNORMAL HIGH (ref 0.61–1.24)
GFR, Estimated: 53 mL/min — ABNORMAL LOW (ref >60)
Glucose, Bld: 145 mg/dL — ABNORMAL HIGH (ref 70–99)
Potassium: 4.2 mmol/L (ref 3.5–5.1)
Sodium: 137 mmol/L (ref 135–145)

## 2021-11-06 MED ORDER — ONDANSETRON 4 MG PO TBDP: 4.0000 mg | ORAL_TABLET | Freq: Three times a day (TID) | ORAL | 0 refills | Status: AC | PRN

## 2021-11-06 MED ORDER — TAMSULOSIN HCL 0.4 MG PO CAPS: 0.4000 mg | ORAL_CAPSULE | Freq: Every day | ORAL | 0 refills | Status: DC

## 2021-11-06 MED ORDER — OXYCODONE-ACETAMINOPHEN 5-325 MG PO TABS: 1.0000 | ORAL_TABLET | Freq: Four times a day (QID) | ORAL | 0 refills | Status: DC | PRN

## 2021-11-06 MED ORDER — OXYCODONE-ACETAMINOPHEN 5-325 MG PO TABS: 1.0000 | ORAL_TABLET | Freq: Four times a day (QID) | ORAL | 0 refills | Status: AC | PRN

## 2021-11-06 MED ORDER — ONDANSETRON HCL 4 MG/2ML IJ SOLN
4.0000 mg | Freq: Once | INTRAMUSCULAR | Status: AC
  Administered 2021-11-06: 4 mg via INTRAVENOUS
  Filled 2021-11-06: qty 2

## 2021-11-06 MED ORDER — SODIUM CHLORIDE 0.9 % IV BOLUS
1000.0000 mL | Freq: Once | INTRAVENOUS | Status: AC
  Administered 2021-11-06: 1000 mL via INTRAVENOUS

## 2021-11-06 MED ORDER — TAMSULOSIN HCL 0.4 MG PO CAPS: 0.4000 mg | ORAL_CAPSULE | Freq: Every day | ORAL | 0 refills | Status: AC

## 2021-11-06 MED ORDER — FENTANYL CITRATE PF 50 MCG/ML IJ SOSY
50.0000 ug | PREFILLED_SYRINGE | Freq: Once | INTRAMUSCULAR | Status: AC
  Administered 2021-11-06: 50 ug via INTRAVENOUS
  Filled 2021-11-06: qty 1

## 2021-11-06 MED ORDER — ONDANSETRON 4 MG PO TBDP: 4.0000 mg | ORAL_TABLET | Freq: Three times a day (TID) | ORAL | 0 refills | Status: DC | PRN

## 2021-11-06 MED ORDER — HYDROMORPHONE HCL 1 MG/ML IJ SOLN
0.5000 mg | Freq: Once | INTRAMUSCULAR | Status: AC
  Administered 2021-11-06: 0.5 mg via INTRAVENOUS
  Filled 2021-11-06: qty 0.5

## 2021-11-06 NOTE — ED Triage Notes (Signed)
Pt to er, pt states that he is in lots of pain, states that the pain started around 730am, states that his pain is in his R flank and also pain in his penis.  Denies bleeding.  States that he has a hx of kidney stones and this feels similar.

## 2021-11-06 NOTE — Discharge Instructions (Signed)
Please drink plenty of fluids.  You can use Percocet every 6 hours for breakthrough pain.  You may use Zofran ODT every 8 hours for nausea or vomiting.  This will dissolve under your tongue.  Please take 1 Flomax pill daily until you pass the kidney stone or until you run out.  I have also given you follow-up with urology for further evaluation.  You may schedule an appointment at your earliest convenience.  Please return to the emergency department for any worsening symptoms you might have.

## 2021-11-06 NOTE — ED Provider Notes (Signed)
[facility] EMERGENCY DEPARTMENT Provider Note   CSN: [id number] Arrival date & time: 11/06/21  1523     History Chief Complaint  Patient presents with   Flank Pain    Devin Jackson is a 61 y.o. male patient who presents to the emergency department today for further evaluation of right-sided flank pain that radiates into the groin and penis.  This started around 7 AM this morning.  Patient does endorse a history of kidney stones and states this feels similar.  He also reports associated nausea and vomiting.  Pain is intense and described as sharp and stabbing.  Reports urinary urgency but not producing a lot of urine.  Flank Pain       Home Medications Prior to Admission medications   Medication Sig Start Date End Date Taking? Authorizing Provider  albuterol (VENTOLIN HFA) 108 (90 Base) MCG/ACT inhaler Inhale 2 puffs into the lungs every 6 (six) hours as needed for wheezing or shortness of breath. Patient not taking: Reported on 11/11/2020 11/20/19   Kathie Dike, MD  amLODipine (NORVASC) 5 MG tablet Take 1 tablet (5 mg total) by mouth daily. 11/13/20   Barrett, Evelene Croon, PA-C  aspirin 81 MG EC tablet Take 1 tablet (81 mg total) by mouth daily. Swallow whole. 11/13/20   Barrett, Evelene Croon, PA-C  atorvastatin (LIPITOR) 80 MG tablet Take 1 tablet (80 mg total) by mouth daily. 11/13/20   Barrett, Evelene Croon, PA-C  clopidogrel (PLAVIX) 75 MG tablet Take 300 mg ( 4 Tablets) on Day one and then One tablet daily there after. 01/21/21   Satira Sark, MD  ezetimibe (ZETIA) 10 MG tablet Take 1 tablet (10 mg total) by mouth daily. 03/25/21 06/23/21  Satira Sark, MD  famotidine (PEPCID) 20 MG tablet Take 20 mg by mouth daily.    [provider]  guaiFENesin-dextromethorphan (ROBITUSSIN DM) 100-10 MG/5ML syrup Take 10 mLs by mouth every 4 (four) hours as needed for cough. Patient not taking: Reported on 11/11/2020 11/20/19   Kathie Dike, MD  nitroGLYCERIN (NITROSTAT) 0.4 MG SL  tablet Place 1 tablet (0.4 mg total) under the tongue every 5 (five) minutes x 3 doses as needed for chest pain. 11/12/20   Barrett, Evelene Croon, PA-C  ondansetron (ZOFRAN-ODT) 4 MG disintegrating tablet Take 1 tablet (4 mg total) by mouth every 8 (eight) hours as needed for nausea or vomiting. 11/06/21   Hendricks Limes, PA-C  oxyCODONE-acetaminophen (PERCOCET/ROXICET) 5-325 MG tablet Take 1 tablet by mouth every 6 (six) hours as needed for severe pain. 11/06/21   Hendricks Limes, PA-C  tamsulosin (FLOMAX) 0.4 MG CAPS capsule Take 1 capsule (0.4 mg total) by mouth daily. 11/06/21   Hendricks Limes, PA-C      Allergies    Morphine and related    Review of Systems   Review of Systems  Genitourinary:  Positive for flank pain.  All other systems reviewed and are negative.   Physical Exam Updated Vital Signs BP (!) 158/82   Pulse 61   Temp 97.7 F (36.5 C) (Oral)   Resp 20   Ht 5\' 8"  (1.727 m)   Wt 79.4 kg   BMI 26.61 kg/m  Physical Exam Vitals and nursing note reviewed.  Constitutional:      General: He is not in acute distress.    Appearance: Normal appearance.  HENT:     Head: Normocephalic and atraumatic.  Eyes:     General:  Right eye: No discharge.        Left eye: No discharge.  Cardiovascular:     Comments: Regular rate and rhythm.  S1/S2 are distinct without any evidence of murmur, rubs, or gallops.  Radial pulses are 2+ bilaterally.  Dorsalis pedis pulses are 2+ bilaterally.  No evidence of pedal edema. Pulmonary:     Comments: Clear to auscultation bilaterally.  Normal effort.  No respiratory distress.  No evidence of wheezes, rales, or rhonchi heard throughout. Abdominal:     General: Abdomen is flat. Bowel sounds are normal. There is no distension.     Tenderness: There is abdominal tenderness in the right lower quadrant. There is right CVA tenderness. There is no guarding or rebound.  Musculoskeletal:        General: Normal range of motion.     Cervical  back: Neck supple.  Skin:    General: Skin is warm and dry.     Findings: No rash.  Neurological:     General: No focal deficit present.     Mental Status: He is alert.  Psychiatric:        Mood and Affect: Mood normal.        Behavior: Behavior normal.     ED Results / Procedures / Treatments   Labs (all labs ordered are listed, but only abnormal results are displayed) Labs Reviewed  CBC WITH DIFFERENTIAL/PLATELET - Abnormal; Notable for the following components:      Result Value   WBC 14.5 (*)    Neutro Abs 12.7 (*)    Abs Immature Granulocytes 0.08 (*)    All other components within normal limits  BASIC METABOLIC PANEL - Abnormal; Notable for the following components:   Glucose, Bld 145 (*)    Creatinine, Ser 1.48 (*)    GFR, Estimated 53 (*)    All other components within normal limits  URINALYSIS, ROUTINE W REFLEX MICROSCOPIC - Abnormal; Notable for the following components:   APPearance HAZY (*)    Glucose, UA 50 (*)    Hgb urine dipstick SMALL (*)    Protein, ur 30 (*)    Bacteria, UA RARE (*)    All other components within normal limits    EKG None  Radiology CT Renal Stone Study  Result Date: 11/06/2021 CLINICAL DATA:  Right flank pain, history of nephrolithiasis EXAM: CT ABDOMEN AND PELVIS WITHOUT CONTRAST TECHNIQUE: Multidetector CT imaging of the abdomen and pelvis was performed following the standard protocol without IV contrast. RADIATION DOSE REDUCTION: This exam was performed according to the departmental dose-optimization program which includes automated exposure control, adjustment of the mA and/or kV according to patient size and/or use of iterative reconstruction technique. COMPARISON:  09/10/2005 by report only FINDINGS: Lower chest: Coronary calcifications. No pleural or pericardial effusion. Visualized lung bases clear. Hepatobiliary: No focal liver abnormality is seen. No gallstones, gallbladder wall thickening, or biliary dilatation. Pancreas:  Unremarkable. No pancreatic ductal dilatation or surrounding inflammatory changes. Spleen: Normal in size without focal abnormality. Adrenals/Urinary Tract: No adrenal mass. 3 mm left lower pole renal calculus. There is mild right hydronephrosis and ureterectasis. 5 mm calculus in the urinary bladder just medial to the expected location of the ureteral orifice. 2.7 cm 17 HU partially exophytic lesions from the left kidney, no follow-up recommended. Stomach/Bowel: Stomach and small bowel nondistended, unremarkable. Normal appendix. The colon is incompletely distended, unremarkable. Vascular/Lymphatic: Mild scattered calcified aortoiliac plaque without aneurysm. No abdominal or pelvic adenopathy. Reproductive: Prostate enlargement Other: No ascites.  No free air. Musculoskeletal: No acute findings IMPRESSION: 1. 5 mm calculus at or just beyond the right ureteral orifice, with mild hydronephrosis and ureterectasis. 2. Nonobstructive left urolithiasis Electronically Signed   By: Corlis Leak  Hassell M.D.   On: 11/06/2021 16:54    Procedures Procedures    Medications Ordered in ED Medications  fentaNYL (SUBLIMAZE) injection 50 mcg (50 mcg Intravenous Given 11/06/21 1613)  ondansetron (ZOFRAN) injection 4 mg (4 mg Intravenous Given 11/06/21 1613)  sodium chloride 0.9 % bolus 1,000 mL (1,000 mLs Intravenous New Bag/Given 11/06/21 1613)  HYDROmorphone (DILAUDID) injection 0.5 mg (0.5 mg Intravenous Given 11/06/21 1700)    ED Course/ Medical Decision Making/ A&P Clinical Course as of 11/06/21 1823  Thu Nov 06, 2021  1733 On reevaluation, patient is doing much better from a pain perspective.  I went over all labs and imaging with him at the bedside. [CF]  1811 On repeat evaluation, patient ready to go home.  [CF]  1811 CBC with Differential(!) There is evidence of leukocytosis. [CF]  1811 Basic metabolic panel(!) There is elevated creatinine from baseline.  His creatinine was elevated 11 months ago similarly.  Patient  is able to tolerate p.o. drink. [CF]  1812 Urinalysis, Routine w reflex microscopic Urine, Clean Catch(!) No signs of infection. [CF]  1820 CT Renal Stone Study There is evidence of a 5 mm kidney stone in the right ureter.  I personally ordered and interpreted the study.  I do agree with the radiologist interpretation. [CF]    Clinical Course User Index [CF] Teressa LowerFleming, Luan Maberry M, PA-C                           Medical Decision Making Devin Jackson is a 61 y.o. male patient who presents to the emergency department today for further evaluation of right flank pain that radiates into the right lower quadrant and penis.  I am suspicious for kidney stone given the patient's history.  I will get labs, give him fluids, Zofran, fentanyl for pain, and a CT renal stone study.  I will plan to reassess frequently to make sure his pain is adequately controlled.  After fentanyl, fluids, and half milligram Dilaudid, patient feeling better to go home.  I feel the patient would likely benefit from further evaluation with urology given the size of the stone and this is the patient's second recurrence of ureterolithiasis.  I will send him home with pain medication, antiemetics, and Flomax to help with stone expulsion.  He is safe for discharge at this time.  Strict return precautions were discussed.  Amount and/or Complexity of Data Reviewed Labs: ordered. Decision-making details documented in ED Course. Radiology: ordered.  Risk Prescription drug management.   Final Clinical Impression(s) / ED Diagnoses Final diagnoses:  Ureterolithiasis    Rx / DC Orders ED Discharge Orders          Ordered    oxyCODONE-acetaminophen (PERCOCET/ROXICET) 5-325 MG tablet  Every 6 hours PRN,   Status:  Discontinued        11/06/21 1813    ondansetron (ZOFRAN-ODT) 4 MG disintegrating tablet  Every 8 hours PRN,   Status:  Discontinued        11/06/21 1813    tamsulosin (FLOMAX) 0.4 MG CAPS capsule  Daily,   Status:   Discontinued        11/06/21 1814    oxyCODONE-acetaminophen (PERCOCET/ROXICET) 5-325 MG tablet  Every 6 hours PRN,   Status:  Discontinued        11/06/21 1822    tamsulosin (FLOMAX) 0.4 MG CAPS capsule  Daily,   Status:  Discontinued        11/06/21 1822    ondansetron (ZOFRAN-ODT) 4 MG disintegrating tablet  Every 8 hours PRN,   Status:  Discontinued        11/06/21 1822    tamsulosin (FLOMAX) 0.4 MG CAPS capsule  Daily        11/06/21 1823    ondansetron (ZOFRAN-ODT) 4 MG disintegrating tablet  Every 8 hours PRN        11/06/21 1823    oxyCODONE-acetaminophen (PERCOCET/ROXICET) 5-325 MG tablet  Every 6 hours PRN        11/06/21 1823              Teressa Lower, PA-C 11/06/21 1823    Jacalyn Lefevre, MD 11/06/21 2349

## 2022-07-10 IMAGING — DX DG CHEST 1V PORT
1 series · 1 of 1 positions shown · non-contrast
Comparison: None.

CLINICAL DATA: Cough and shortness of breath.

EXAM:
PORTABLE CHEST 1 VIEW

[chest ap]
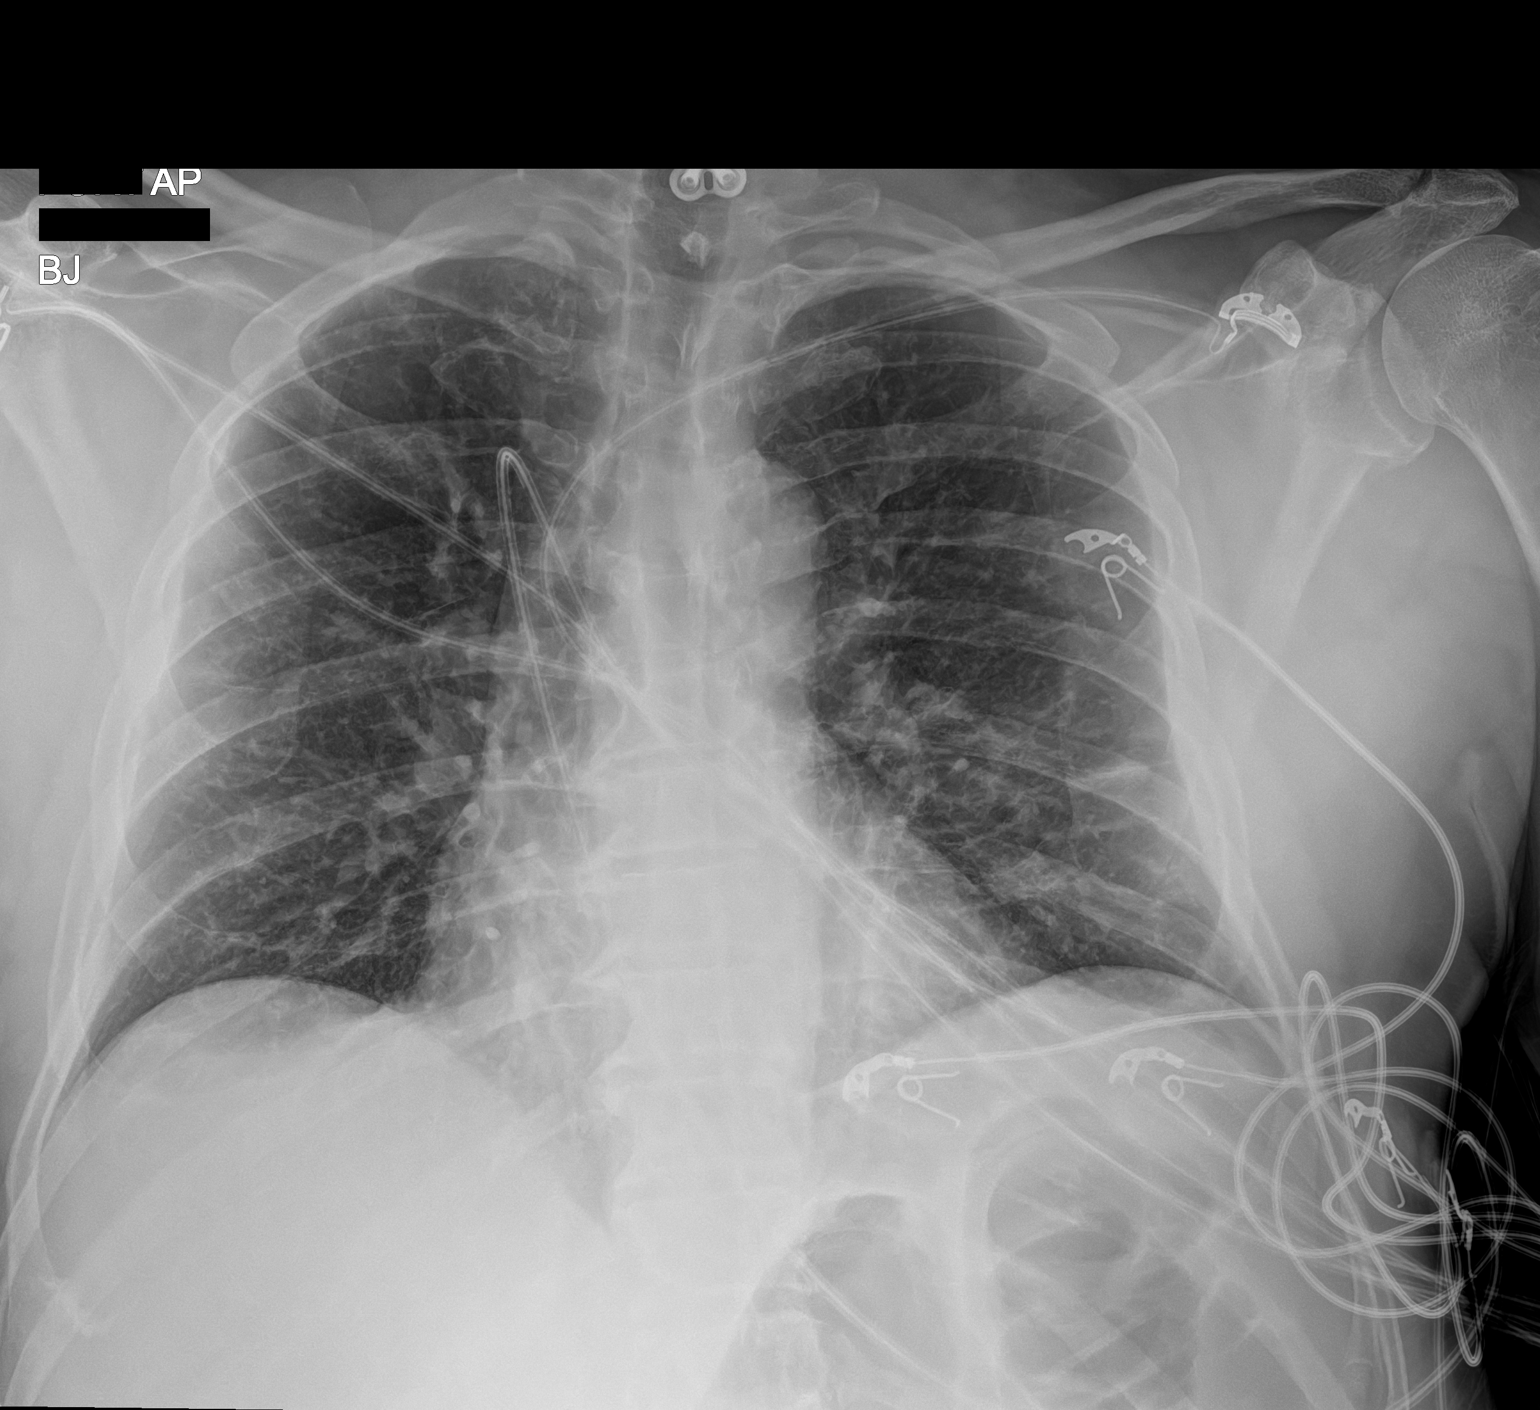

[1 of 1 positions shown; findings below may reference images not displayed]

FINDINGS: Mild, diffuse, chronic appearing increased interstitial lung
markings are seen. Mild areas of linear atelectasis are seen along
the lateral aspect of the left lung base. There is no evidence of a
pleural effusion or pneumothorax. The heart size and mediastinal
contours are within normal limits. A radiopaque fusion plate and
screws are seen overlying the lower cervical spine. Degenerative
changes are noted throughout the thoracic spine.
IMPRESSION: Chronic appearing increased interstitial lung markings with mild
left basilar linear atelectasis.

## 2022-09-29 ENCOUNTER — Telehealth: Payer: Self-pay

## 2022-09-29 NOTE — Telephone Encounter (Signed)
Attempted follow up/wellness call to [facility] client enrolled through [facility]. No answer, left voicemail requesting return call.  Last appt at [facility] 05/26/22 and next appt 11/25/22. Last A1C on 05/26/22 was 6.4.  Francee Nodal RN [employer]

## 2023-07-04 IMAGING — DX DG CHEST 2V
2 series · 2 of 2 positions shown · non-contrast
Comparison: 11/18/2019

CLINICAL DATA: Chest pain and dizziness.  Weakness.

EXAM:
CHEST - 2 VIEW

[chest pa]
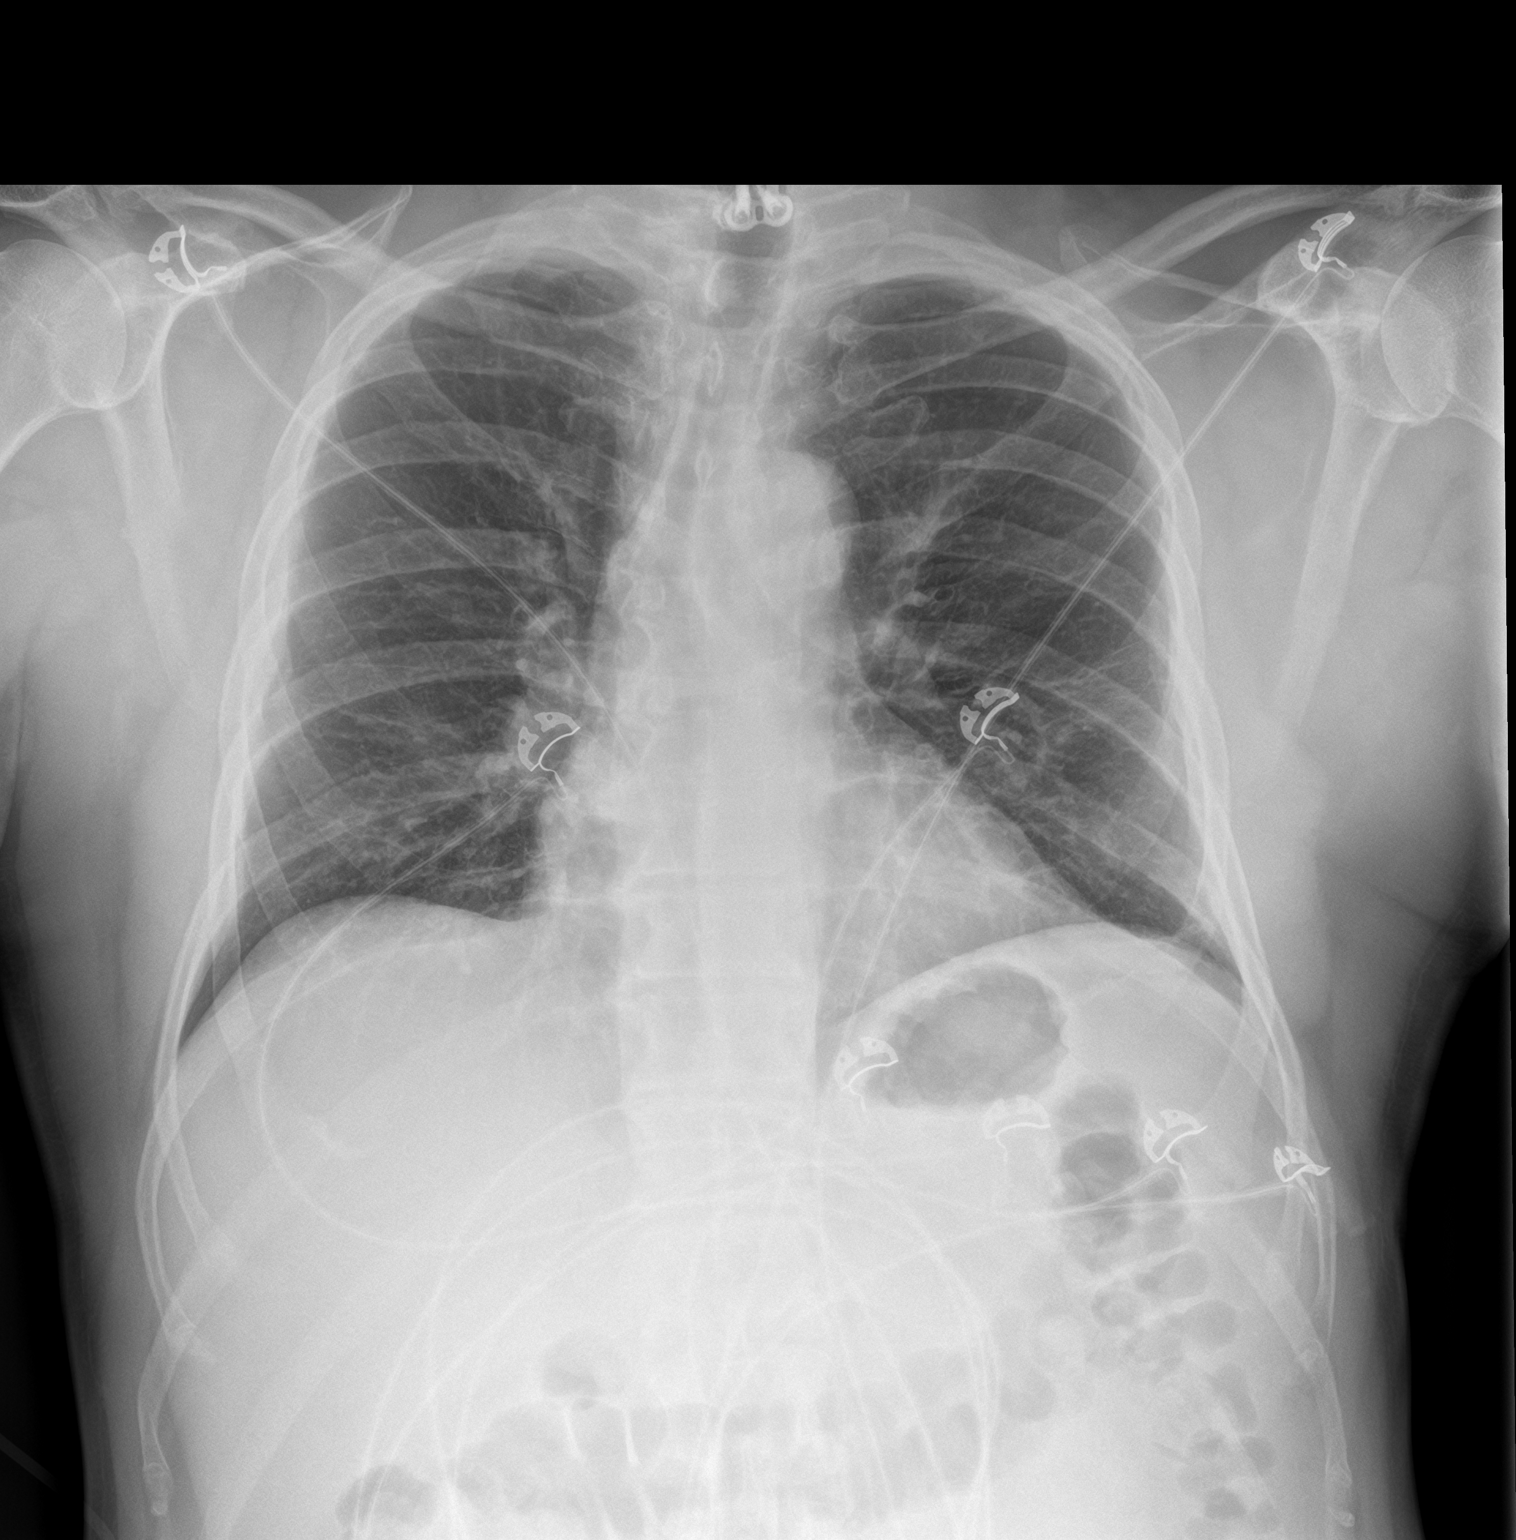

[chest lat]
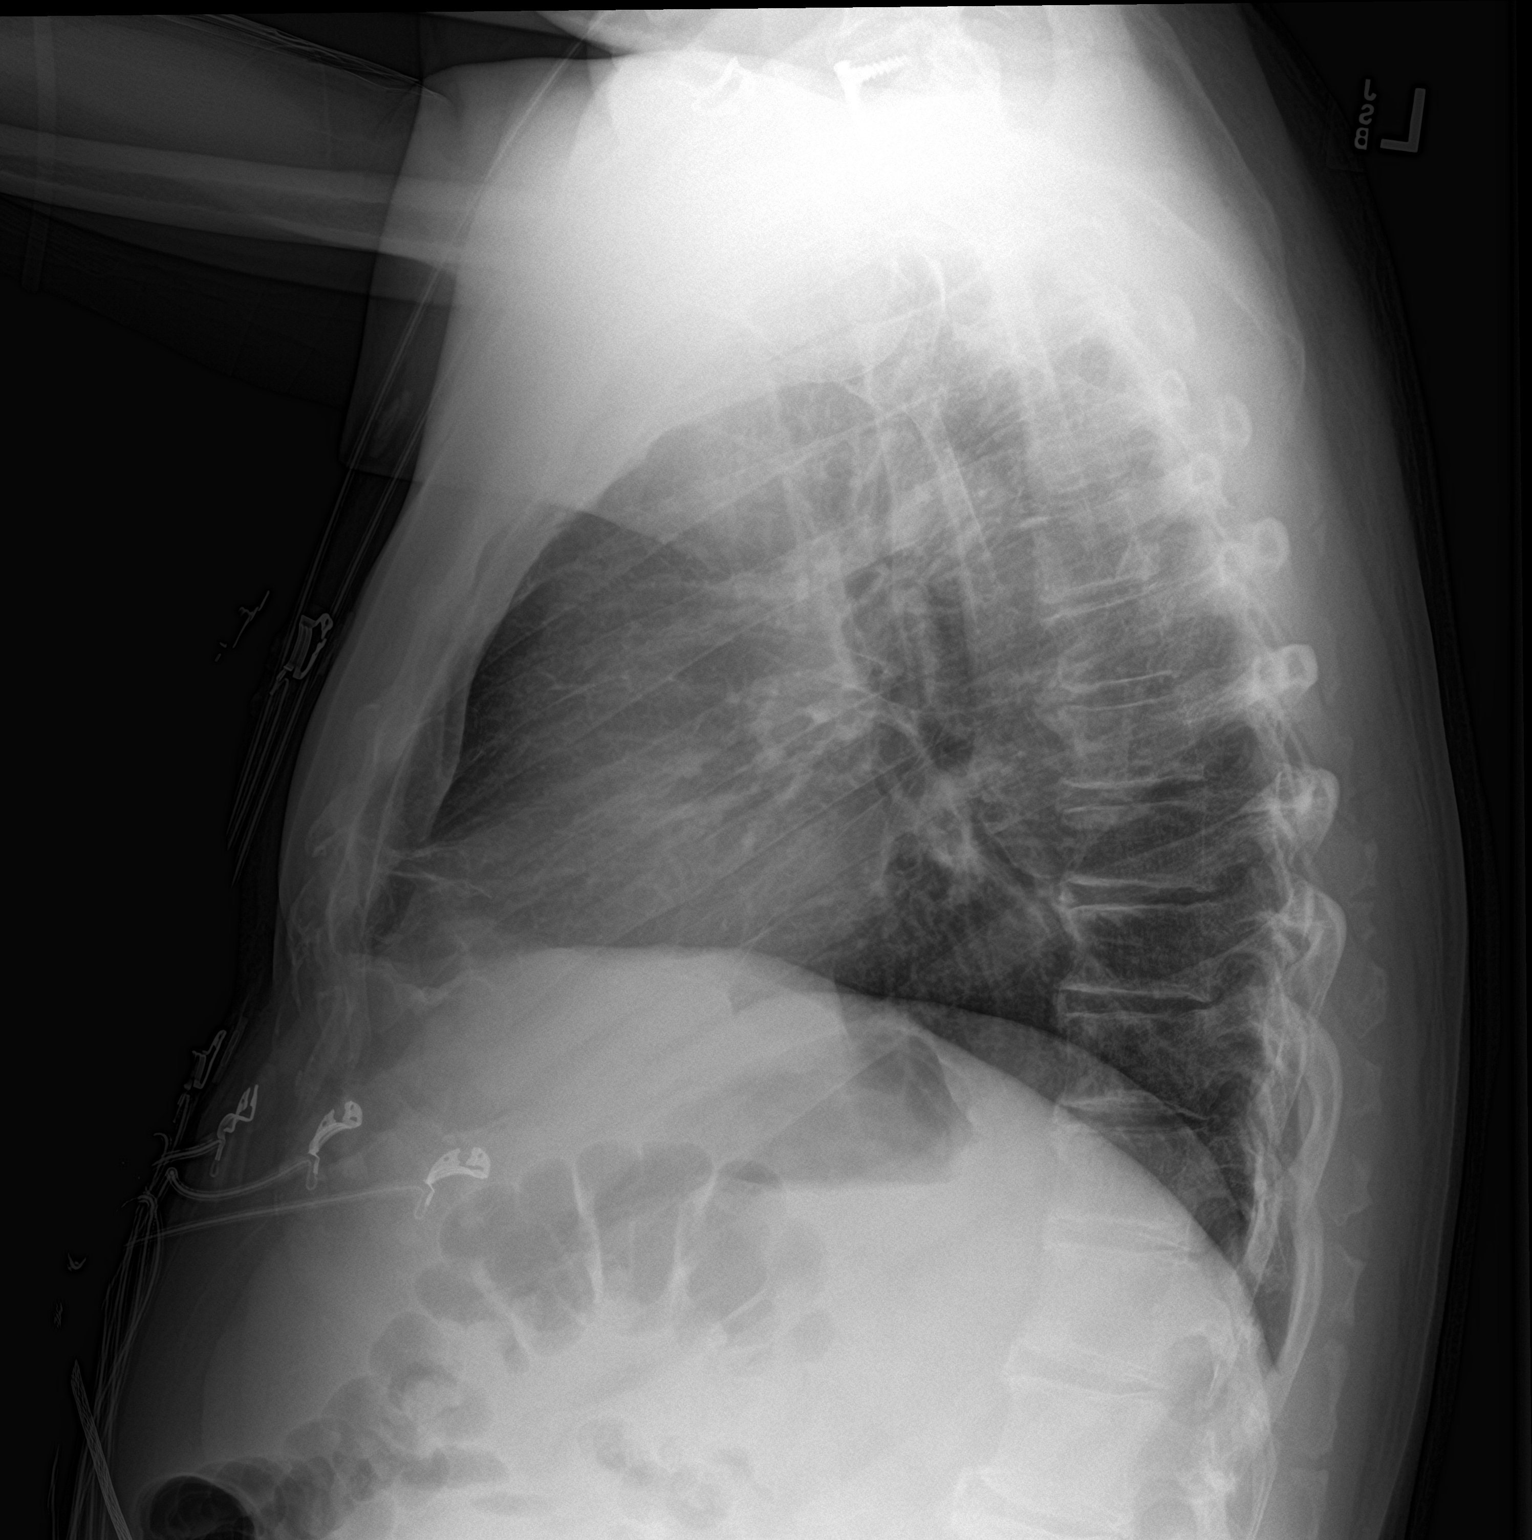

[2 of 2 positions shown; findings below may reference images not displayed]

FINDINGS: Stable cardiomediastinal contours. No pleural effusion or edema. No
airspace densities identified. Scarring identified within the left
midlung and left base. No superimposed airspace scratch set the
visualized osseous structures are unremarkable. No acute
abnormality.
IMPRESSION: No acute cardiopulmonary abnormalities.

## 2023-08-11 ENCOUNTER — Telehealth: Payer: Self-pay

## 2023-08-11 NOTE — Telephone Encounter (Signed)
 Attempted to follow up by phone of [facility] client. Next appointment at [facility] is scheduled for 08/25/23 No answer today, left message requesting return call.  Will continue to monitor and follow up after next scheduled appointment.  Last A1C was 6.4 on 4.16.25   Devin JONELLE Skeen RN [employer]

## 2023-10-25 ENCOUNTER — Other Ambulatory Visit: Payer: Self-pay

## 2023-10-25 ENCOUNTER — Emergency Department (HOSPITAL_COMMUNITY): Payer: Self-pay

## 2023-10-25 ENCOUNTER — Encounter (HOSPITAL_COMMUNITY): Payer: Self-pay

## 2023-10-25 ENCOUNTER — Emergency Department (HOSPITAL_COMMUNITY)
Admission: EM | Admit: 2023-10-25 | Discharge: 2023-10-25 | Disposition: A | Discharge status: Home / Self Care | Payer: Self-pay | Source: Ambulatory Visit | Attending: Emergency Medicine | Admitting: Emergency Medicine

## 2023-10-25 DIAGNOSIS — M7989 Other specified soft tissue disorders: Secondary | ICD-10-CM

## 2023-10-25 DIAGNOSIS — R2242 Localized swelling, mass and lump, left lower limb: Secondary | ICD-10-CM | POA: Insufficient documentation

## 2023-10-25 DIAGNOSIS — Z7982 Long term (current) use of aspirin: Secondary | ICD-10-CM | POA: Insufficient documentation

## 2023-10-25 LAB — CBC WITH DIFFERENTIAL/PLATELET
Abs Immature Granulocytes: 0.02 K/uL (ref 0.00–0.07)
Basophils Absolute: 0.1 K/uL (ref 0.0–0.1)
Basophils Relative: 1 %
Eosinophils Absolute: 0.1 K/uL (ref 0.0–0.5)
Eosinophils Relative: 2 %
HCT: 46.9 % (ref 39.0–52.0)
Hemoglobin: 15.6 g/dL (ref 13.0–17.0)
Immature Granulocytes: 0 %
Lymphocytes Relative: 26 %
Lymphs Abs: 1.7 K/uL (ref 0.7–4.0)
MCH: 28.6 pg (ref 26.0–34.0)
MCHC: 33.3 g/dL (ref 30.0–36.0)
MCV: 86.1 fL (ref 80.0–100.0)
Monocytes Absolute: 0.8 K/uL (ref 0.1–1.0)
Monocytes Relative: 13 %
Neutro Abs: 3.7 K/uL (ref 1.7–7.7)
Neutrophils Relative %: 58 %
Platelets: 192 K/uL (ref 150–400)
RBC: 5.45 MIL/uL (ref 4.22–5.81)
RDW: 13.7 % (ref 11.5–15.5)
WBC: 6.4 K/uL (ref 4.0–10.5)
nRBC: 0 % (ref 0.0–0.2)

## 2023-10-25 LAB — COMPREHENSIVE METABOLIC PANEL WITH GFR
ALT: 33 U/L (ref 0–44)
AST: 27 U/L (ref 15–41)
Albumin: 3.9 g/dL (ref 3.5–5.0)
Alkaline Phosphatase: 81 U/L (ref 38–126)
Anion gap: 8 (ref 5–15)
BUN: 16 mg/dL (ref 8–23)
CO2: 24 mmol/L (ref 22–32)
Calcium: 9.3 mg/dL (ref 8.9–10.3)
Chloride: 104 mmol/L (ref 98–111)
Creatinine, Ser: 1.61 mg/dL — ABNORMAL HIGH (ref 0.61–1.24)
GFR, Estimated: 47 mL/min — ABNORMAL LOW (ref >60)
Glucose, Bld: 164 mg/dL — ABNORMAL HIGH (ref 70–99)
Potassium: 3.7 mmol/L (ref 3.5–5.1)
Sodium: 136 mmol/L (ref 135–145)
Total Bilirubin: 0.9 mg/dL (ref 0.0–1.2)
Total Protein: 7.3 g/dL (ref 6.5–8.1)

## 2023-10-25 LAB — PROTIME-INR
INR: 1 (ref 0.8–1.2)
Prothrombin Time: 13.4 s (ref 11.4–15.2)

## 2023-10-25 LAB — APTT: aPTT: 28 s (ref 24–36)

## 2023-10-25 NOTE — Discharge Instructions (Signed)
 Please continue to wear the knee immobilizer and follow-up closely with orthopedics on an outpatient basis.  Return to emergency department immediately for any new or worsening symptoms.

## 2023-10-25 NOTE — ED Provider Notes (Cosign Needed Addendum)
 [place] EMERGENCY DEPARTMENT AT [facility] Provider Note   CSN: [id number] Arrival date & time: 10/25/23  1243     Patient presents with: Leg Pain   Devin Jackson is a 63 y.o. male.   Patient is a 62 year old male who presents to Emergency Department with a chief complaint of swelling to the anterior aspect of his left knee as well as some swelling distally to that along the tib-fib region with bruising.  He was evaluated by his primary care doctor this morning and sent to the emergency department for further evaluation.  Patient notes he has had no recent falls or blunt trauma that he knows of.  He denies any numbness or paresthesias distally.  He has had no overlying erythema or warmth.  He notes that he mainly has pain with ambulation.  He denies any previous injuries or surgeries to the affected extremity.   Leg Pain      Prior to Admission medications   Medication Sig Start Date End Date Taking? Authorizing Provider  albuterol  (VENTOLIN  HFA) 108 (90 Base) MCG/ACT inhaler Inhale 2 puffs into the lungs every 6 (six) hours as needed for wheezing or shortness of breath. Patient not taking: Reported on 11/11/2020 11/20/19   Antoinette Doe, MD  amLODipine  (NORVASC ) 5 MG tablet Take 1 tablet (5 mg total) by mouth daily. 11/13/20   Barrett, Shona MATSU, PA-C  aspirin  81 MG EC tablet Take 1 tablet (81 mg total) by mouth daily. Swallow whole. 11/13/20   Barrett, Shona MATSU, PA-C  atorvastatin  (LIPITOR ) 80 MG tablet Take 1 tablet (80 mg total) by mouth daily. 11/13/20   Barrett, Shona MATSU, PA-C  clopidogrel  (PLAVIX ) 75 MG tablet Take 300 mg ( 4 Tablets) on Day one and then One tablet daily there after. 01/21/21   Debera Jayson MATSU, MD  ezetimibe  (ZETIA ) 10 MG tablet Take 1 tablet (10 mg total) by mouth daily. 03/25/21 06/23/21  Debera Jayson MATSU, MD  famotidine  (PEPCID ) 20 MG tablet Take 20 mg by mouth daily.    [provider]  guaiFENesin -dextromethorphan  (ROBITUSSIN DM) 100-10  MG/5ML syrup Take 10 mLs by mouth every 4 (four) hours as needed for cough. Patient not taking: Reported on 11/11/2020 11/20/19   Antoinette Doe, MD  nitroGLYCERIN  (NITROSTAT ) 0.4 MG SL tablet Place 1 tablet (0.4 mg total) under the tongue every 5 (five) minutes x 3 doses as needed for chest pain. 11/12/20   Barrett, Shona MATSU, PA-C  ondansetron  (ZOFRAN -ODT) 4 MG disintegrating tablet Take 1 tablet (4 mg total) by mouth every 8 (eight) hours as needed for nausea or vomiting. 11/06/21   Theotis Peers M, PA-C  oxyCODONE -acetaminophen  (PERCOCET/ROXICET) 5-325 MG tablet Take 1 tablet by mouth every 6 (six) hours as needed for severe pain. 11/06/21   Theotis Peers M, PA-C  tamsulosin  (FLOMAX ) 0.4 MG CAPS capsule Take 1 capsule (0.4 mg total) by mouth daily. 11/06/21   Theotis Peers HERO, PA-C    Allergies: Morphine and codeine    Review of Systems  Musculoskeletal:        Left knee pain and swelling  All other systems reviewed and are negative.   Updated Vital Signs BP (!) 142/72 (BP Location: Right Arm)   Pulse 68   Temp 98.2 F (36.8 C) (Oral)   Resp 16   Ht 5' 8 (1.727 m)   Wt 79.8 kg   SpO2 98%   BMI 26.76 kg/m   Physical Exam Vitals and nursing note reviewed.  Constitutional:  Appearance: Normal appearance.  HENT:     Head: Normocephalic and atraumatic.  Eyes:     Extraocular Movements: Extraocular movements intact.     Conjunctiva/sclera: Conjunctivae normal.     Pupils: Pupils are equal, round, and reactive to light.  Cardiovascular:     Rate and Rhythm: Normal rate and regular rhythm.     Pulses: Normal pulses.     Heart sounds: Normal heart sounds.  Pulmonary:     Effort: Pulmonary effort is normal. No respiratory distress.  Musculoskeletal:        General: Normal range of motion.     Comments: Tender to palpation noted along the anterior aspect of the left knee just over the tibial tuberosity with a area of localized edema, edema noted over the tib-fib region and  calf, scattered ecchymosis noted, no overlying erythema, warmth, nontender palpation over left hip, ankle, foot, DP and PT pulses are 2+ distally, sensation intact distally, full range of motion noted throughout, no obvious deformity, no skin breakdown ulceration, no lacerations or abrasions  Skin:    General: Skin is warm and dry.  Neurological:     General: No focal deficit present.     Mental Status: He is alert and oriented to person, place, and time. Mental status is at baseline.  Psychiatric:        Mood and Affect: Mood normal.        Behavior: Behavior normal.        Thought Content: Thought content normal.        Judgment: Judgment normal.     (all labs ordered are listed, but only abnormal results are displayed) Labs Reviewed  COMPREHENSIVE METABOLIC PANEL WITH GFR - Abnormal; Notable for the following components:      Result Value   Glucose, Bld 164 (*)    Creatinine, Ser 1.61 (*)    GFR, Estimated 47 (*)    All other components within normal limits  CBC WITH DIFFERENTIAL/PLATELET  PROTIME-INR  APTT    EKG: None  Radiology: DG Tibia/Fibula Left Result Date: 10/25/2023 CLINICAL DATA:  pain, swelling, diffuse EXAM: LEFT TIBIA AND FIBULA - 2 VIEW COMPARISON:  None Available. FINDINGS: No acute fracture or dislocation. There is no evidence of arthropathy or other focal bone abnormality. Soft tissues are unremarkable. IMPRESSION: No acute fracture or malalignment of the tibia and fibula. Electronically Signed   By: Rogelia Myers M.D.   On: 10/25/2023 14:33   US  Venous Img Lower  Left (DVT Study) Result Date: 10/25/2023 CLINICAL DATA:  pain, swelling, diffuse EXAM: LEFT LOWER EXTREMITY VENOUS DOPPLER ULTRASOUND TECHNIQUE: Gray-scale sonography with graded compression, as well as color Doppler and duplex ultrasound were performed to evaluate the lower extremity deep venous systems from the level of the common femoral vein and including the common femoral, femoral, profunda  femoral, popliteal and calf veins including the posterior tibial, peroneal and gastrocnemius veins when visible. The superficial great saphenous vein was also interrogated. Spectral Doppler was utilized to evaluate flow at rest and with distal augmentation maneuvers in the common femoral, femoral and popliteal veins. COMPARISON:  None Available. FINDINGS: Contralateral Common Femoral Vein: Respiratory phasicity is normal and symmetric with the symptomatic side. No evidence of thrombus. Normal compressibility. Common Femoral Vein: No evidence of thrombus. Normal compressibility, respiratory phasicity and response to augmentation. Saphenofemoral Junction: No evidence of thrombus. Normal compressibility and flow on color Doppler imaging. Profunda Femoral Vein: No evidence of thrombus. Normal compressibility and flow on color Doppler imaging.  Femoral Vein: No evidence of thrombus. Normal compressibility, respiratory phasicity and response to augmentation. Popliteal Vein: No evidence of thrombus. Normal compressibility, respiratory phasicity and response to augmentation. Calf Veins: No evidence of thrombus. Normal compressibility and flow on color Doppler imaging. Superficial Great Saphenous Vein: No evidence of thrombus. Normal compressibility. Other Findings:  None. IMPRESSION: Negative for deep venous thrombosis in the left leg. Electronically Signed   By: Rogelia Myers M.D.   On: 10/25/2023 14:32   DG Knee Complete 4 Views Left Result Date: 10/25/2023 CLINICAL DATA:  pain, swelling, diffuse EXAM: LEFT KNEE - COMPLETE 4+ VIEW COMPARISON:  None Available. FINDINGS: No acute fracture or dislocation. No joint effusion. Mild joint space loss within the patellofemoral and medial compartments with tricompartmental osteophyte formation. Soft tissues are otherwise unremarkable. IMPRESSION: 1. No acute fracture or dislocation. 2. Changes of mild osteoarthritis of the knee. Electronically Signed   By: Rogelia Myers M.D.    On: 10/25/2023 14:29     Procedures   Medications Ordered in the ED - No data to display                                  Medical Decision Making Amount and/or Complexity of Data Reviewed Labs: ordered. Radiology: ordered.   This patient presents to the ED for concern of swelling to lower extremity and knee differential diagnosis includes contusion, sprain, strain, tendon injury, DVT, septic joint, gout    Additional history obtained:  Additional history obtained from none External records from outside source obtained and reviewed including none none   Lab Tests:  I Ordered, and personally interpreted labs.  The pertinent results include: No leukocytosis, no anemia, mild elevation of creatinine from baseline, normal electrolytes, normal liver function, unremarkable INR and PTT   Imaging Studies ordered:  I ordered imaging studies including x-ray of left tib-fib, knee, duplex of left lower extremity I independently visualized and interpreted imaging which showed no acute osseous injury or lesions, no DVT I agree with the radiologist interpretation    Problem List / ED Course:  Patient is doing well at this time and is stable for discharge home.  Discussed with patient that all workup in the emergency department has been unremarkable.  He has no indication for DVT at this time.  Do not suspect acute arterial occlusion as he has 2+ DP and PT pulses distally.  He has no indication for septic joint or gout.  Patient may have partial injury to the distal patellar tendon as the swelling is directly over the tibial tuberosity.  Do not suspect a septic bursitis at this point.  Will place in a knee immobilizer and recommend close follow-up with orthopedics for continued evaluation.  Do not suspect any further workup is warranted at this time.  The importance of close follow-up was discussed as well as strict turn precautions for any new or worsening symptoms.  Patient voiced  understanding and had no additional questions.   Social Determinants of Health:  None        Final diagnoses:  Swelling of left lower extremity    ED Discharge Orders     None          Daralene Lonni BIRCH, PA-C 10/25/23 1448    Daralene Lonni BIRCH, PA-C 10/25/23 1449    Charlyn Sora, MD 10/26/23 [phone]

## 2023-10-25 NOTE — ED Triage Notes (Signed)
 Pt arrived via POV from PCP office for further evaluation of left leg, knee and ankle pain. Pt presents paperwork from PCP Office to this RN stating concern for possible DVT due to spontaneous ecchymosis and edema to LLE that began last Tuesday.
